# Patient Record
Sex: Female | Born: 1945 | Race: Black or African American | Hispanic: No | Smoking: Former smoker
Health system: Southern US, Community
[De-identification: ages and names within clinical notes are randomized; demographics above are authoritative.]

## PROBLEM LIST (undated history)

## (undated) DIAGNOSIS — I2510 Atherosclerotic heart disease of native coronary artery without angina pectoris: Secondary | ICD-10-CM

## (undated) DIAGNOSIS — Z89512 Acquired absence of left leg below knee: Secondary | ICD-10-CM

## (undated) DIAGNOSIS — I10 Essential (primary) hypertension: Secondary | ICD-10-CM

## (undated) DIAGNOSIS — D649 Anemia, unspecified: Secondary | ICD-10-CM

## (undated) DIAGNOSIS — J449 Chronic obstructive pulmonary disease, unspecified: Secondary | ICD-10-CM

## (undated) DIAGNOSIS — R569 Unspecified convulsions: Secondary | ICD-10-CM

## (undated) DIAGNOSIS — I509 Heart failure, unspecified: Secondary | ICD-10-CM

## (undated) DIAGNOSIS — E785 Hyperlipidemia, unspecified: Secondary | ICD-10-CM

## (undated) DIAGNOSIS — E119 Type 2 diabetes mellitus without complications: Secondary | ICD-10-CM

## (undated) DIAGNOSIS — G4730 Sleep apnea, unspecified: Secondary | ICD-10-CM

## (undated) HISTORY — DX: Acquired absence of left leg below knee: Z89.512

---

## 2003-08-03 ENCOUNTER — Other Ambulatory Visit: Payer: Self-pay

## 2003-12-30 ENCOUNTER — Other Ambulatory Visit: Payer: Self-pay

## 2004-08-12 ENCOUNTER — Ambulatory Visit: Payer: Self-pay | Admitting: Ophthalmology

## 2004-08-22 ENCOUNTER — Ambulatory Visit: Payer: Self-pay | Admitting: Ophthalmology

## 2004-09-15 ENCOUNTER — Emergency Department: Payer: Self-pay | Admitting: Emergency Medicine

## 2006-05-08 HISTORY — PX: BELOW KNEE LEG AMPUTATION: SUR23

## 2006-07-04 ENCOUNTER — Inpatient Hospital Stay: Payer: Self-pay | Admitting: Surgery

## 2006-09-05 ENCOUNTER — Encounter: Payer: Self-pay | Admitting: Family Medicine

## 2006-09-06 ENCOUNTER — Encounter: Payer: Self-pay | Admitting: Family Medicine

## 2006-10-07 ENCOUNTER — Encounter: Payer: Self-pay | Admitting: Family Medicine

## 2006-11-06 ENCOUNTER — Encounter: Payer: Self-pay | Admitting: Family Medicine

## 2006-11-16 ENCOUNTER — Other Ambulatory Visit: Payer: Self-pay

## 2006-11-16 ENCOUNTER — Inpatient Hospital Stay: Payer: Self-pay | Admitting: Vascular Surgery

## 2006-12-07 ENCOUNTER — Encounter: Payer: Self-pay | Admitting: Internal Medicine

## 2006-12-11 ENCOUNTER — Inpatient Hospital Stay: Payer: Self-pay | Admitting: Internal Medicine

## 2006-12-11 ENCOUNTER — Other Ambulatory Visit: Payer: Self-pay

## 2007-02-01 ENCOUNTER — Emergency Department: Payer: Self-pay | Admitting: Unknown Physician Specialty

## 2008-09-30 ENCOUNTER — Ambulatory Visit: Payer: Self-pay | Admitting: Ophthalmology

## 2008-11-25 ENCOUNTER — Ambulatory Visit: Payer: Self-pay | Admitting: Ophthalmology

## 2010-08-30 ENCOUNTER — Ambulatory Visit: Payer: Self-pay | Admitting: Urology

## 2011-09-07 ENCOUNTER — Ambulatory Visit: Payer: Self-pay | Admitting: Family Medicine

## 2012-12-10 ENCOUNTER — Emergency Department: Payer: Self-pay | Admitting: Emergency Medicine

## 2012-12-10 LAB — COMPREHENSIVE METABOLIC PANEL
Alkaline Phosphatase: 121 U/L (ref 50–136)
Anion Gap: 6 — ABNORMAL LOW (ref 7–16)
BUN: 27 mg/dL — ABNORMAL HIGH (ref 7–18)
Bilirubin,Total: 0.4 mg/dL (ref 0.2–1.0)
Chloride: 109 mmol/L — ABNORMAL HIGH (ref 98–107)
Co2: 24 mmol/L (ref 21–32)
Creatinine: 1.53 mg/dL — ABNORMAL HIGH (ref 0.60–1.30)
EGFR (African American): 41 — ABNORMAL LOW
EGFR (Non-African Amer.): 35 — ABNORMAL LOW
Glucose: 195 mg/dL — ABNORMAL HIGH (ref 65–99)
SGPT (ALT): 19 U/L (ref 12–78)

## 2012-12-10 LAB — URINALYSIS, COMPLETE
Glucose,UR: NEGATIVE mg/dL (ref 0–75)
Leukocyte Esterase: NEGATIVE
Protein: 100
WBC UR: 3 /HPF (ref 0–5)

## 2012-12-10 LAB — CBC
MCHC: 32.6 g/dL (ref 32.0–36.0)
MCV: 89 fL (ref 80–100)
Platelet: 199 10*3/uL (ref 150–440)
RDW: 14.2 % (ref 11.5–14.5)
WBC: 9.2 10*3/uL (ref 3.6–11.0)

## 2012-12-10 LAB — DRUG SCREEN, URINE
Barbiturates, Ur Screen: NEGATIVE (ref ?–200)
Cannabinoid 50 Ng, Ur ~~LOC~~: NEGATIVE (ref ?–50)
Cocaine Metabolite,Ur ~~LOC~~: NEGATIVE (ref ?–300)
Phencyclidine (PCP) Ur S: NEGATIVE (ref ?–25)
Tricyclic, Ur Screen: NEGATIVE (ref ?–1000)

## 2013-05-17 ENCOUNTER — Inpatient Hospital Stay: Payer: Self-pay | Admitting: Internal Medicine

## 2013-05-17 LAB — URINALYSIS, COMPLETE
Bilirubin,UR: NEGATIVE
Ketone: NEGATIVE
Nitrite: NEGATIVE
PH: 5 (ref 4.5–8.0)
Protein: NEGATIVE
RBC,UR: 1 /HPF (ref 0–5)
SPECIFIC GRAVITY: 1.01 (ref 1.003–1.030)

## 2013-05-17 LAB — CBC WITH DIFFERENTIAL/PLATELET
BASOS ABS: 0.1 10*3/uL (ref 0.0–0.1)
Basophil %: 0.9 %
Eosinophil #: 0.3 10*3/uL (ref 0.0–0.7)
Eosinophil %: 3 %
HCT: 37.8 % (ref 35.0–47.0)
HGB: 12.4 g/dL (ref 12.0–16.0)
LYMPHS ABS: 2.3 10*3/uL (ref 1.0–3.6)
Lymphocyte %: 23.7 %
MCH: 29 pg (ref 26.0–34.0)
MCHC: 32.7 g/dL (ref 32.0–36.0)
MCV: 89 fL (ref 80–100)
Monocyte #: 0.7 x10 3/mm (ref 0.2–0.9)
Monocyte %: 6.7 %
NEUTROS ABS: 6.5 10*3/uL (ref 1.4–6.5)
Neutrophil %: 65.7 %
Platelet: 192 10*3/uL (ref 150–440)
RBC: 4.27 10*6/uL (ref 3.80–5.20)
RDW: 13.5 % (ref 11.5–14.5)
WBC: 9.9 10*3/uL (ref 3.6–11.0)

## 2013-05-17 LAB — COMPREHENSIVE METABOLIC PANEL
ALBUMIN: 3.4 g/dL (ref 3.4–5.0)
ALK PHOS: 134 U/L — AB
ALT: 22 U/L (ref 12–78)
AST: 14 U/L — AB (ref 15–37)
Anion Gap: 8 (ref 7–16)
BUN: 27 mg/dL — ABNORMAL HIGH (ref 7–18)
Bilirubin,Total: 0.5 mg/dL (ref 0.2–1.0)
CALCIUM: 8.9 mg/dL (ref 8.5–10.1)
CHLORIDE: 101 mmol/L (ref 98–107)
CO2: 25 mmol/L (ref 21–32)
CREATININE: 2.11 mg/dL — AB (ref 0.60–1.30)
EGFR (African American): 28 — ABNORMAL LOW
EGFR (Non-African Amer.): 24 — ABNORMAL LOW
Glucose: 274 mg/dL — ABNORMAL HIGH (ref 65–99)
Osmolality: 283 (ref 275–301)
Potassium: 3.8 mmol/L (ref 3.5–5.1)
SODIUM: 134 mmol/L — AB (ref 136–145)
Total Protein: 7.3 g/dL (ref 6.4–8.2)

## 2013-05-17 LAB — CK TOTAL AND CKMB (NOT AT ARMC)
CK, Total: 143 U/L (ref 21–215)
CK-MB: 1.8 ng/mL (ref 0.5–3.6)

## 2013-05-17 LAB — TROPONIN I

## 2013-05-18 LAB — BASIC METABOLIC PANEL
Anion Gap: 4 — ABNORMAL LOW (ref 7–16)
BUN: 28 mg/dL — AB (ref 7–18)
CALCIUM: 8.9 mg/dL (ref 8.5–10.1)
Chloride: 102 mmol/L (ref 98–107)
Co2: 28 mmol/L (ref 21–32)
Creatinine: 1.84 mg/dL — ABNORMAL HIGH (ref 0.60–1.30)
EGFR (Non-African Amer.): 28 — ABNORMAL LOW
GFR CALC AF AMER: 33 — AB
Glucose: 240 mg/dL — ABNORMAL HIGH (ref 65–99)
OSMOLALITY: 282 (ref 275–301)
Potassium: 4.3 mmol/L (ref 3.5–5.1)
SODIUM: 134 mmol/L — AB (ref 136–145)

## 2013-05-18 LAB — CBC WITH DIFFERENTIAL/PLATELET
Basophil #: 0.3 10*3/uL — ABNORMAL HIGH (ref 0.0–0.1)
Basophil %: 2.4 %
EOS PCT: 2.1 %
Eosinophil #: 0.3 10*3/uL (ref 0.0–0.7)
HCT: 36.3 % (ref 35.0–47.0)
HGB: 11.8 g/dL — AB (ref 12.0–16.0)
Lymphocyte #: 2.3 10*3/uL (ref 1.0–3.6)
Lymphocyte %: 18.7 %
MCH: 28.8 pg (ref 26.0–34.0)
MCHC: 32.5 g/dL (ref 32.0–36.0)
MCV: 89 fL (ref 80–100)
MONO ABS: 0.9 x10 3/mm (ref 0.2–0.9)
Monocyte %: 7.3 %
NEUTROS PCT: 69.5 %
Neutrophil #: 8.5 10*3/uL — ABNORMAL HIGH (ref 1.4–6.5)
PLATELETS: 172 10*3/uL (ref 150–440)
RBC: 4.1 10*6/uL (ref 3.80–5.20)
RDW: 13.6 % (ref 11.5–14.5)
WBC: 12.3 10*3/uL — AB (ref 3.6–11.0)

## 2013-05-19 LAB — BASIC METABOLIC PANEL
Anion Gap: 5 — ABNORMAL LOW (ref 7–16)
BUN: 21 mg/dL — ABNORMAL HIGH (ref 7–18)
CO2: 27 mmol/L (ref 21–32)
CREATININE: 1.41 mg/dL — AB (ref 0.60–1.30)
Calcium, Total: 9.1 mg/dL (ref 8.5–10.1)
Chloride: 104 mmol/L (ref 98–107)
EGFR (Non-African Amer.): 39 — ABNORMAL LOW
GFR CALC AF AMER: 45 — AB
Glucose: 282 mg/dL — ABNORMAL HIGH (ref 65–99)
Osmolality: 285 (ref 275–301)
Potassium: 4.3 mmol/L (ref 3.5–5.1)
Sodium: 136 mmol/L (ref 136–145)

## 2013-05-19 LAB — CBC WITH DIFFERENTIAL/PLATELET
Basophil #: 0.1 10*3/uL (ref 0.0–0.1)
Basophil %: 0.9 %
EOS ABS: 0.3 10*3/uL (ref 0.0–0.7)
Eosinophil %: 2.5 %
HCT: 36.1 % (ref 35.0–47.0)
HGB: 11.8 g/dL — AB (ref 12.0–16.0)
Lymphocyte #: 2.1 10*3/uL (ref 1.0–3.6)
Lymphocyte %: 19.7 %
MCH: 29.1 pg (ref 26.0–34.0)
MCHC: 32.9 g/dL (ref 32.0–36.0)
MCV: 89 fL (ref 80–100)
Monocyte #: 0.7 x10 3/mm (ref 0.2–0.9)
Monocyte %: 6.3 %
NEUTROS ABS: 7.6 10*3/uL — AB (ref 1.4–6.5)
NEUTROS PCT: 70.6 %
Platelet: 182 10*3/uL (ref 150–440)
RBC: 4.07 10*6/uL (ref 3.80–5.20)
RDW: 13.7 % (ref 11.5–14.5)
WBC: 10.8 10*3/uL (ref 3.6–11.0)

## 2013-05-20 LAB — URINE CULTURE

## 2013-07-16 ENCOUNTER — Ambulatory Visit: Payer: Self-pay | Admitting: Family Medicine

## 2013-08-28 ENCOUNTER — Ambulatory Visit: Payer: Self-pay | Admitting: Family Medicine

## 2014-08-29 NOTE — Discharge Summary (Signed)
PATIENT NAME:  Barbara Farley, Barbara Farley MR#:  [id number] DATE OF BIRTH:  April 09, 1946  DATE OF ADMISSION:  05/17/2013 DATE OF DISCHARGE:  05/19/2013  DISCHARGE DIAGNOSES: 1.  Acute encephalopathy, likely due to urinary tract infection, now back to baseline.  2.  Chronic kidney disease at baseline.   SECONDARY DIAGNOSES: 1.  Hypertension.  2.  Hyperlipidemia.  3.  Diabetes mellitus.  4.  Coronary artery disease.  5.  Anemia of chronic disease.  6.  Morbid obesity.  7.  Sleep apnea.  8.  Left below-knee amputation.   CONSULTATION: Physical therapy.   PROCEDURES AND RADIOLOGY: CT scan of the head without contrast on January 10,  showed chronic ischemic white matter disease. No acute intracranial abnormality.   Chest x-ray on January 10,  showed cardiomegaly without any acute cardiopulmonary disease.   MAJOR LABORATORY PANEL: Urinalysis on admission showed 3+ bacteria, 42 WBCs, 1+ leukocyte esterase, and WBC in clumps.   The urine culture grew more than 100,000 colonies of strep viridans.    HISTORY AND SHORT HOSPITAL COURSE: The patient is a 69 year old female with the above-mentioned medical problems who was admitted for acute encephalopathy secondary to urinary tract infection. Please see Dr. Ward Givens  dictated history and physical for further details. The patient was treated with IV antibiotics was feeling close to her baseline, and was discharged back to her place on January 12, in stable condition, as she was close to baseline on the date of discharge. Her vital signs are as follows: Temperature 98.6, heart rate 100 per minute, respirations 18 per minute, blood pressure 154/78 mmHg, she was saturating 94% on room air.   PERTINENT PHYSICAL EXAMINATION ON THE DATE OF DISCHARGE:   CARDIOVASCULAR: S1, S2 normal. No murmurs, rubs, or gallop.  LUNGS: Clear to auscultation bilaterally. No wheezing, rales, rhonchi or crepitation.  ABDOMEN: Soft, benign.  NEUROLOGIC: Nonfocal examination.  All  other physical examination remained at baseline.   DISCHARGE MEDICATIONS: 1.  Aspirin 81 mg p.o. daily.  2.  Isosorbide mononitrate 30 mg p.o. daily.  3.  Lisinopril 40 mg p.o. daily.  4.  Paroxetine 20 mg p.o. daily.  5.  Omeprazole 20 mg p.o. daily.  6.  Oxybutynin 10 mg p.o. daily.  7.  Novolin 70/30, 75 units subcutaneous every morning and 25 units every evening.  8.  Lasix 40 mg p.o. daily.  9.  Ergocalciferol 50,000 international units once every month on the first Monday of every month after eight weeks.  10. Amlodipine/atorvastatin 5/40 mg 1 tablet p.o. daily.  11.  Colace 100 mg p.o. b.i.d. as needed.  12.  Levaquin 250 mg p.o. daily for the rest of the course as prescribed.   DISCHARGE DIET: Regular.   DISCHARGE ACTIVITY: As tolerated.   DISCHARGE INSTRUCTIONS AND FOLLOW-UP: The patient was instructed to follow up with her primary care physician with [facility] Dr. Dorthea Cove in 1 to 2 weeks.   TOTAL TIME DISCHARGING THIS PATIENT: 35 minutes.   ____________________________ Lucina Mellow. Manuella Ghazi, MD vss:cc D: 05/21/2013 23:08:10 ET T: 05/21/2013 23:51:31 ET JOB#: [id number]  cc: Ephram Kornegay S. Manuella Ghazi, MD, <Dictator> Danelle Berry. Derrek Monaco, [place] MD ELECTRONICALLY SIGNED 05/28/2013 14:25

## 2014-08-29 NOTE — H&P (Signed)
Barbara Farley NAME:  Barbara Farley, Barbara Farley MR#:  [id number] DATE OF BIRTH:  1945/10/04  DATE OF ADMISSION:  05/18/2013  PRIMARY CARE PHYSICIAN: Dorthea Cove, M.D.   REFERRING PHYSICIAN:  Dr. Kerman Passey.   CHIEF COMPLAINT: Altered mental status.   HISTORY OF PRESENT ILLNESS: Barbara Farley is a 69 year old African-American female with past medical history of hypertension, diabetes mellitus, coronary artery disease, chronic kidney disease, left BKA, is brought to the Emergency Department by EMS for altered mental status. The Barbara Farley has home health, came to visit the Barbara Farley and was found to be somewhat confused.  Concerning this, the Barbara Farley is brought to the Emergency Department. Workup in the Emergency Department with CT head without contrast: No acute intracranial abnormality. The Barbara Farley is found to have found to be found to have a urinary tract infection. Per Barbara Farley's sister, the Barbara Farley had some baseline slurred speech; however, this significantly worse from the baseline. The Barbara Farley received one dose of Rocephin in the Emergency Department.  Urine cultures have been obtained. The Barbara Farley is able to provide most of the history. The Barbara Farley states that started to experiences severe generalized body aches, started around 7:00 p.m. Denies having any headache, nausea, vomiting, abdominal pain, denies having any cough.   PAST MEDICAL HISTORY: 1.  Hypertension.  2.  Hyperlipidemia.  3.  Diabetes mellitus, insulin-dependent.  4.  Coronary artery disease.  5.  Anemia of chronic disease.  6.  Morbid obesity. 7.  Sleep apnea, on CPAP.  8.  History of tobacco use.  9.  History of gluteal abscess, status post I and D.  10.  Left BKA.    ALLERGIES: No known drug allergies.   HOME MEDICATIONS: 1.  Paroxetine 20 mg once a day.  2.  Oxybutynin 10 mg once a day.  3.  Omeprazole 20 mg daily. 4.   Novolin 70/30, 75 units daily.  5.  Lisinopril 40 mg once a day.  6.  Imdur 1 tablet once a day.  7.  Lasix  40 mg once a day.  8.  Vitamin D 50,000 units weekly.  9.  Aspirin 81 mg once a day.  10.  Amlodipine/atorvastatin 1 tablet once a day.   SOCIAL HISTORY: Continues to smoke, denies drinking alcohol.  Lives by herself. The Barbara Farley's sister helps her with some of the activities.   FAMILY HISTORY: Positive for diabetes mellitus.   REVIEW OF SYSTEMS: CONSTITUTIONAL: Generalized weakness.  EYES: Has left eye blindness.  ENT: No sore throat, cough, shortness of breath.  RESPIRATORY: No cough, shortness of breath.  CARDIOVASCULAR: No chest pain, palpitations.  GASTROINTESTINAL: No nausea, vomiting, abdominal pain.  GENITOURINARY: No dysuria or hematuria.  ENDOCRINE: The Barbara Farley has diagnosis of diabetes mellitus. HEMATOLOGIC: No easy bruising or bleeding.  SKIN: No rash or lesions.  MUSCULOSKELETAL: The Barbara Farley states has generalized body aches.  NEUROLOGICAL: No weakness or numbness in any part of the body.   PHYSICAL EXAMINATION: GENERAL: This is a well-developed, well-nourished, age-appropriate female lying down in the bed, not in distress.  VITAL SIGNS: Temperature 98.4, pulse 91, blood pressure 139/67, respiratory rate of 22, oxygen saturations 96% on room air.  HEENT: Head normocephalic, atraumatic. There is no sclerae icterus. Conjunctivae normal. Mucous membranes are dry. Could not examine the oropharynx.   NECK: Supple. No lymphadenopathy. No JVD. No carotid bruit. No thyromegaly.  CHEST: Has no focal tenderness.  LUNGS: Bilaterally clear to auscultation.  HEART: S1, S2 regular. No murmurs are heard.  ABDOMEN: Bowel sounds present. Soft, nontender,  nondistended. Could not appreciate any hepatosplenomegaly.  EXTREMITIES: Left below knee amputation. Right pulses palpable. Good range of motion in all the extremities.  NEUROLOGIC: The Barbara Farley is somewhat somnolent. Oriented to place, person and time. No pronator drift. No meningeal signs. Moving all four extremities.   LABS: CMP,  BMP;  BUN 37, creatinine of 1.84, basic metabolic profile within normal limits.   CK 143, CK-MB 1.8. Troponin less than 0.02.   CBC is completely within normal limits.   ASSESSMENT AND PLAN: Barbara Farley is a 69 year old female who comes to the Emergency Department with altered mental status.  1.  Altered mental status. This could be from the urinary tract infection, however cannot also exclude stroke. Will treat the underlying infection and follow up. If  Barbara Farley continues to have the confusion by tomorrow morning, we will consider getting an MRI.  2.  Urinary tract infection. Obtain urine cultures and keep the Barbara Farley on Rocephin.  3.  Diabetes mellitus. Continue the home dose of the 70/30 insulin.  4.  Hypertension, currently well controlled. Continue with home medications.  5.  Keep the Barbara Farley on deep vein thrombosis prophylaxis with Lovenox.   TIME SPENT: 50 minutes.     ____________________________ Monica Becton, MD pv:NTS D: 05/18/2013 01:05:09 ET T: 05/18/2013 01:20:07 ET JOB#: [id number]  cc: Monica Becton, MD, <Dictator> Danelle Berry. Derrek Monaco, MD Grier Mitts Mikal Wisman MD ELECTRONICALLY SIGNED 06/01/2013 21:13

## 2015-05-29 ENCOUNTER — Emergency Department
Admission: EM | Admit: 2015-05-29 | Discharge: 2015-05-29 | Disposition: A | Discharge status: Home / Self Care | Payer: Medicare (Managed Care) | Attending: Emergency Medicine | Admitting: Emergency Medicine

## 2015-05-29 ENCOUNTER — Encounter: Payer: Self-pay | Admitting: Emergency Medicine

## 2015-05-29 ENCOUNTER — Emergency Department: Payer: Medicare (Managed Care)

## 2015-05-29 DIAGNOSIS — E119 Type 2 diabetes mellitus without complications: Secondary | ICD-10-CM | POA: Insufficient documentation

## 2015-05-29 DIAGNOSIS — F17200 Nicotine dependence, unspecified, uncomplicated: Secondary | ICD-10-CM | POA: Diagnosis not present

## 2015-05-29 DIAGNOSIS — Z89512 Acquired absence of left leg below knee: Secondary | ICD-10-CM | POA: Diagnosis not present

## 2015-05-29 DIAGNOSIS — N12 Tubulo-interstitial nephritis, not specified as acute or chronic: Secondary | ICD-10-CM | POA: Diagnosis not present

## 2015-05-29 DIAGNOSIS — R109 Unspecified abdominal pain: Secondary | ICD-10-CM

## 2015-05-29 DIAGNOSIS — I10 Essential (primary) hypertension: Secondary | ICD-10-CM | POA: Insufficient documentation

## 2015-05-29 HISTORY — DX: Essential (primary) hypertension: I10

## 2015-05-29 HISTORY — DX: Unspecified convulsions: R56.9

## 2015-05-29 HISTORY — DX: Type 2 diabetes mellitus without complications: E11.9

## 2015-05-29 LAB — COMPREHENSIVE METABOLIC PANEL
ALT: 21 U/L (ref 14–54)
ANION GAP: 8 (ref 5–15)
AST: 18 U/L (ref 15–41)
Albumin: 3.8 g/dL (ref 3.5–5.0)
Alkaline Phosphatase: 110 U/L (ref 38–126)
BILIRUBIN TOTAL: 0.8 mg/dL (ref 0.3–1.2)
BUN: 35 mg/dL — ABNORMAL HIGH (ref 6–20)
CHLORIDE: 103 mmol/L (ref 101–111)
CO2: 27 mmol/L (ref 22–32)
Calcium: 9.1 mg/dL (ref 8.9–10.3)
Creatinine, Ser: 2.05 mg/dL — ABNORMAL HIGH (ref 0.44–1.00)
GFR, EST AFRICAN AMERICAN: 28 mL/min — AB (ref >60)
GFR, EST NON AFRICAN AMERICAN: 24 mL/min — AB (ref >60)
Glucose, Bld: 214 mg/dL — ABNORMAL HIGH (ref 65–99)
POTASSIUM: 4.1 mmol/L (ref 3.5–5.1)
Sodium: 138 mmol/L (ref 135–145)
TOTAL PROTEIN: 7.4 g/dL (ref 6.5–8.1)

## 2015-05-29 LAB — CBC WITH DIFFERENTIAL/PLATELET
BASOS ABS: 0.1 10*3/uL (ref 0–0.1)
Basophils Relative: 1 %
EOS PCT: 1 %
Eosinophils Absolute: 0.1 10*3/uL (ref 0–0.7)
HEMATOCRIT: 38.1 % (ref 35.0–47.0)
Hemoglobin: 12.2 g/dL (ref 12.0–16.0)
LYMPHS PCT: 19 %
Lymphs Abs: 1.5 10*3/uL (ref 1.0–3.6)
MCH: 27.7 pg (ref 26.0–34.0)
MCHC: 31.9 g/dL — AB (ref 32.0–36.0)
MCV: 86.9 fL (ref 80.0–100.0)
MONO ABS: 0.6 10*3/uL (ref 0.2–0.9)
MONOS PCT: 8 %
Neutro Abs: 5.6 10*3/uL (ref 1.4–6.5)
Neutrophils Relative %: 71 %
PLATELETS: 197 10*3/uL (ref 150–440)
RBC: 4.39 MIL/uL (ref 3.80–5.20)
RDW: 14 % (ref 11.5–14.5)
WBC: 7.9 10*3/uL (ref 3.6–11.0)

## 2015-05-29 LAB — URINALYSIS COMPLETE WITH MICROSCOPIC (ARMC ONLY)
Bilirubin Urine: NEGATIVE
Ketones, ur: NEGATIVE mg/dL
Nitrite: NEGATIVE
PH: 5 (ref 5.0–8.0)
Protein, ur: 30 mg/dL — AB
Specific Gravity, Urine: 1.008 (ref 1.005–1.030)

## 2015-05-29 LAB — TROPONIN I: Troponin I: 0.03 ng/mL (ref <0.031)

## 2015-05-29 LAB — LIPASE, BLOOD: LIPASE: 19 U/L (ref 11–51)

## 2015-05-29 MED ORDER — ONDANSETRON HCL 4 MG PO TABS: 4.0000 mg | ORAL_TABLET | Freq: Every day | ORAL | Status: DC | PRN

## 2015-05-29 MED ORDER — ONDANSETRON HCL 4 MG/2ML IJ SOLN
4.0000 mg | Freq: Once | INTRAMUSCULAR | Status: AC
  Administered 2015-05-29: 4 mg via INTRAVENOUS
  Filled 2015-05-29: qty 2

## 2015-05-29 MED ORDER — SODIUM CHLORIDE 0.9 % IV BOLUS (SEPSIS)
500.0000 mL | Freq: Once | INTRAVENOUS | Status: AC
  Administered 2015-05-29: 500 mL via INTRAVENOUS

## 2015-05-29 MED ORDER — CEPHALEXIN 500 MG PO CAPS: 500.0000 mg | ORAL_CAPSULE | Freq: Three times a day (TID) | ORAL | Status: AC

## 2015-05-29 MED ORDER — DEXTROSE 5 % IV SOLN
2.0000 g | Freq: Once | INTRAVENOUS | Status: AC
  Administered 2015-05-29: 2 g via INTRAVENOUS
  Filled 2015-05-29: qty 2

## 2015-05-29 MED ORDER — MORPHINE SULFATE (PF) 4 MG/ML IV SOLN
4.0000 mg | Freq: Once | INTRAVENOUS | Status: AC
  Administered 2015-05-29: 4 mg via INTRAVENOUS
  Filled 2015-05-29: qty 1

## 2015-05-29 NOTE — ED Notes (Signed)
Pt states needs transport home - offered to call her a cab, but she states doesn't have the money. Advised ems could be called but not covered by insurance since not medically necessary. Pt asked that I call the ems, not a cab or family. (states no family/ friends to help her)

## 2015-05-29 NOTE — ED Notes (Addendum)
Patient O2 dropping to 83% while she's sleeping. RN and MD notified. Patient placed on 2L nasal cannula per MD order. O2  currently at 90%

## 2015-05-29 NOTE — ED Provider Notes (Signed)
[facility] Emergency Department Provider Note  ____________________________________________  Time seen: Seen upon arrival to the emergency department  I have reviewed the triage vital signs and the nursing notes.   HISTORY  Chief Complaint Flank Pain    HPI Barbara Farley is a 70 y.o. female history of diabetes and hypertension who is presenting today with bilateral flank and abdominal pain. She says that the pain is worse on the left and is sharp and intermittent. She says that the pain as a 5 out of 10 right now. It is associated with nausea and vomiting. She says that the pain is radiating from both flanks up under her breasts bilaterally into her abdomen. She says that she is a history of kidney infections. She has been urinating more frequently over the past month but is not having any burning. She says that she has had kidney infections in the past without any burning with urination. She said that her sugar was over 600 this morning and she took insulin and for EMS was in the 340s.   Past Medical History  Diagnosis Date  . Diabetes mellitus without complication ([place])   . Hypertension   . Seizures ([place])     There are no active problems to display for this patient.   Past Surgical History  Procedure Laterality Date  . Below knee leg amputation      No current outpatient prescriptions on file.  Allergies Review of patient's allergies indicates no known allergies.  History reviewed. No pertinent family history.  Social History Social History  Substance Use Topics  . Smoking status: Current Some Day Smoker  . Smokeless tobacco: None  . Alcohol Use: No    Review of Systems Constitutional: No fever/chills Eyes: No visual changes. ENT: No sore throat. Cardiovascular: Denies chest pain. Respiratory: Denies shortness of breath. Gastrointestinal: No diarrhea.  No constipation. Genitourinary: Negative for dysuria. Musculoskeletal:  Bilateral flank pain Skin: Negative for rash. Neurological: Negative for headaches, focal weakness or numbness.  10-point ROS otherwise negative.  ____________________________________________   PHYSICAL EXAM:  VITAL SIGNS: ED Triage Vitals  Enc Vitals Group     BP --      Pulse --      Resp --      Temp --      Temp src --      SpO2 --      Weight 05/29/15 1310 300 lb (136.079 kg)     Height 05/29/15 1310 5\' 6"  (1.676 m)     Head Cir --      Peak Flow --      Pain Score 05/29/15 1311 5     Pain Loc --      Pain Edu? --      Excl. in [place]? --     Constitutional: Alert and oriented. Well appearing and in no acute distress. Eyes: Conjunctivae are normal. PERRL. EOMI. Head: Atraumatic. Nose: No congestion/rhinnorhea. Mouth/Throat: Mucous membranes are moist.  Oropharynx non-erythematous. Neck: No stridor.   Cardiovascular: Normal rate, regular rhythm. Grossly normal heart sounds.  Good peripheral circulation. Respiratory: Normal respiratory effort.  No retractions. Lungs CTAB. Gastrointestinal: Soft with mild diffuse tenderness. No distention. No abdominal bruits. Bilateral CVA tenderness to palpation. Musculoskeletal: No lower extremity tenderness nor edema.  No joint effusions. Left lower extremity BKA. Neurologic:  Normal speech and language. No gross focal neurologic deficits are appreciated. No gait instability. Skin:  Skin is warm, dry and intact. No rash noted. Psychiatric: Mood  and affect are normal. Speech and behavior are normal.  ____________________________________________   LABS (all labs ordered are listed, but only abnormal results are displayed)  Labs Reviewed  CBC WITH DIFFERENTIAL/PLATELET - Abnormal; Notable for the following:    MCHC 31.9 (*)    All other components within normal limits  COMPREHENSIVE METABOLIC PANEL - Abnormal; Notable for the following:    Glucose, Bld 214 (*)    BUN 35 (*)    Creatinine, Ser 2.05 (*)    GFR calc non Af Amer 24  (*)    GFR calc Af Amer 28 (*)    All other components within normal limits  URINALYSIS COMPLETEWITH MICROSCOPIC ([facility] ONLY) - Abnormal; Notable for the following:    Color, Urine YELLOW (*)    APPearance CLOUDY (*)    Glucose, UA >500 (*)    Hgb urine dipstick 1+ (*)    Protein, ur 30 (*)    Leukocytes, UA 3+ (*)    Bacteria, UA MANY (*)    Squamous Epithelial / LPF 0-5 (*)    All other components within normal limits  URINE CULTURE  TROPONIN I  LIPASE, BLOOD   ____________________________________________  EKG  ED ECG REPORT I, Doran Stabler, the attending physician, personally viewed and interpreted this ECG.   Date: 05/29/2015  EKG Time: 1321  Rate: 85  Rhythm: normal sinus rhythm  Axis: Borderline left axis deviation.  Intervals:none  ST&T Change: T-wave inversion in aVL. Borderline T-wave inversion in V2. No elevation in V3. Likely read by EKG machine because of static in baseline.  ____________________________________________  RADIOLOGY  CAT scan of the abdomen and pelvis with cholelithiasis without clubbing factors. No acute abnormalities noted. ____________________________________________   PROCEDURES  ____________________________________________   INITIAL IMPRESSION / ASSESSMENT AND PLAN / ED COURSE  Pertinent labs & imaging results that were available during my care of the patient were reviewed by me and considered in my medical decision making (see chart for details).  Patient's pain is controlled at this time. Now tolerating by mouth solids and fluids. Likely diagnosis is pyelonephritis. No kidney stones found on CAT scan and patient has urinary tract infection findings on her urinalysis. We'll give her dose of ceftriaxone in the emergency department. We'll discharge her home with Keflex and Zofran. Patient says the plan is one to comply. Patient with reassuring EKG. Slight acute renal failure likely from dehydration. Patient did have several  episodes of vomiting prior to arrival. Please from mild dehydration. Given 1 L of fluids in the emergency department. She knows to continue plenty of fluids at home. ____________________________________________   FINAL CLINICAL IMPRESSION(S) / ED DIAGNOSES  Final diagnoses:  Bilateral flank pain   pyelonephritis.    Orbie Pyo, MD 05/29/15 1640

## 2015-05-29 NOTE — ED Notes (Signed)
bs 349 - took her insulin at 9am. Flank pain today with frequent urination

## 2015-05-29 NOTE — Discharge Instructions (Signed)

## 2015-05-31 LAB — URINE CULTURE: Culture: >=100000

## 2015-06-18 ENCOUNTER — Emergency Department: Payer: Medicare (Managed Care)

## 2015-06-18 ENCOUNTER — Inpatient Hospital Stay
Admission: EM | Admit: 2015-06-18 | Discharge: 2015-06-19 | DRG: 292 | Disposition: A | Discharge status: Home / Self Care | Payer: Medicare (Managed Care) | Attending: Internal Medicine | Admitting: Internal Medicine

## 2015-06-18 ENCOUNTER — Encounter: Payer: Self-pay | Admitting: Emergency Medicine

## 2015-06-18 DIAGNOSIS — Z79899 Other long term (current) drug therapy: Secondary | ICD-10-CM | POA: Diagnosis not present

## 2015-06-18 DIAGNOSIS — E1122 Type 2 diabetes mellitus with diabetic chronic kidney disease: Secondary | ICD-10-CM | POA: Diagnosis present

## 2015-06-18 DIAGNOSIS — R079 Chest pain, unspecified: Secondary | ICD-10-CM | POA: Diagnosis not present

## 2015-06-18 DIAGNOSIS — I509 Heart failure, unspecified: Secondary | ICD-10-CM | POA: Diagnosis present

## 2015-06-18 DIAGNOSIS — Z6841 Body Mass Index (BMI) 40.0 and over, adult: Secondary | ICD-10-CM | POA: Diagnosis not present

## 2015-06-18 DIAGNOSIS — D649 Anemia, unspecified: Secondary | ICD-10-CM | POA: Diagnosis present

## 2015-06-18 DIAGNOSIS — N179 Acute kidney failure, unspecified: Secondary | ICD-10-CM | POA: Diagnosis present

## 2015-06-18 DIAGNOSIS — J449 Chronic obstructive pulmonary disease, unspecified: Secondary | ICD-10-CM | POA: Diagnosis present

## 2015-06-18 DIAGNOSIS — E1165 Type 2 diabetes mellitus with hyperglycemia: Secondary | ICD-10-CM | POA: Diagnosis present

## 2015-06-18 DIAGNOSIS — N183 Chronic kidney disease, stage 3 (moderate): Secondary | ICD-10-CM | POA: Diagnosis present

## 2015-06-18 DIAGNOSIS — Z89512 Acquired absence of left leg below knee: Secondary | ICD-10-CM

## 2015-06-18 DIAGNOSIS — I130 Hypertensive heart and chronic kidney disease with heart failure and stage 1 through stage 4 chronic kidney disease, or unspecified chronic kidney disease: Secondary | ICD-10-CM | POA: Diagnosis not present

## 2015-06-18 DIAGNOSIS — F17210 Nicotine dependence, cigarettes, uncomplicated: Secondary | ICD-10-CM | POA: Diagnosis present

## 2015-06-18 DIAGNOSIS — E785 Hyperlipidemia, unspecified: Secondary | ICD-10-CM | POA: Diagnosis present

## 2015-06-18 DIAGNOSIS — I2510 Atherosclerotic heart disease of native coronary artery without angina pectoris: Secondary | ICD-10-CM | POA: Diagnosis present

## 2015-06-18 DIAGNOSIS — Z9119 Patient's noncompliance with other medical treatment and regimen: Secondary | ICD-10-CM

## 2015-06-18 DIAGNOSIS — Z7982 Long term (current) use of aspirin: Secondary | ICD-10-CM

## 2015-06-18 DIAGNOSIS — R569 Unspecified convulsions: Secondary | ICD-10-CM | POA: Diagnosis present

## 2015-06-18 DIAGNOSIS — I248 Other forms of acute ischemic heart disease: Secondary | ICD-10-CM | POA: Diagnosis present

## 2015-06-18 DIAGNOSIS — G4730 Sleep apnea, unspecified: Secondary | ICD-10-CM | POA: Diagnosis present

## 2015-06-18 HISTORY — DX: Anemia, unspecified: D64.9

## 2015-06-18 HISTORY — DX: Sleep apnea, unspecified: G47.30

## 2015-06-18 HISTORY — DX: Heart failure, unspecified: I50.9

## 2015-06-18 HISTORY — DX: Atherosclerotic heart disease of native coronary artery without angina pectoris: I25.10

## 2015-06-18 HISTORY — DX: Morbid (severe) obesity due to excess calories: E66.01

## 2015-06-18 HISTORY — DX: Chronic obstructive pulmonary disease, unspecified: J44.9

## 2015-06-18 HISTORY — DX: Hyperlipidemia, unspecified: E78.5

## 2015-06-18 LAB — BASIC METABOLIC PANEL
ANION GAP: 9 (ref 5–15)
BUN: 30 mg/dL — ABNORMAL HIGH (ref 6–20)
CALCIUM: 9.3 mg/dL (ref 8.9–10.3)
CHLORIDE: 105 mmol/L (ref 101–111)
CO2: 24 mmol/L (ref 22–32)
CREATININE: 1.96 mg/dL — AB (ref 0.44–1.00)
GFR calc non Af Amer: 25 mL/min — ABNORMAL LOW (ref >60)
GFR, EST AFRICAN AMERICAN: 29 mL/min — AB (ref >60)
Glucose, Bld: 136 mg/dL — ABNORMAL HIGH (ref 65–99)
Potassium: 5.1 mmol/L (ref 3.5–5.1)
SODIUM: 138 mmol/L (ref 135–145)

## 2015-06-18 LAB — GLUCOSE, CAPILLARY
GLUCOSE-CAPILLARY: 244 mg/dL — AB (ref 65–99)
GLUCOSE-CAPILLARY: 347 mg/dL — AB (ref 65–99)

## 2015-06-18 LAB — CBC
HCT: 40.6 % (ref 35.0–47.0)
HEMOGLOBIN: 13 g/dL (ref 12.0–16.0)
MCH: 28.3 pg (ref 26.0–34.0)
MCHC: 32.1 g/dL (ref 32.0–36.0)
MCV: 88.1 fL (ref 80.0–100.0)
Platelets: 176 10*3/uL (ref 150–440)
RBC: 4.6 MIL/uL (ref 3.80–5.20)
RDW: 13.7 % (ref 11.5–14.5)
WBC: 9.3 10*3/uL (ref 3.6–11.0)

## 2015-06-18 LAB — TROPONIN I: Troponin I: 0.04 ng/mL — ABNORMAL HIGH (ref <0.031)

## 2015-06-18 LAB — TSH: TSH: 0.908 u[IU]/mL (ref 0.350–4.500)

## 2015-06-18 LAB — BRAIN NATRIURETIC PEPTIDE: B NATRIURETIC PEPTIDE 5: 33 pg/mL (ref 0.0–100.0)

## 2015-06-18 MED ORDER — FAMOTIDINE 20 MG PO TABS
20.0000 mg | ORAL_TABLET | Freq: Two times a day (BID) | ORAL | Status: DC
  Administered 2015-06-18 – 2015-06-19 (×2): 20 mg via ORAL
  Filled 2015-06-18 (×2): qty 1

## 2015-06-18 MED ORDER — ENOXAPARIN SODIUM 30 MG/0.3ML ~~LOC~~ SOLN
30.0000 mg | SUBCUTANEOUS | Status: DC
  Administered 2015-06-18: 30 mg via SUBCUTANEOUS
  Filled 2015-06-18: qty 0.3

## 2015-06-18 MED ORDER — DOCUSATE SODIUM 100 MG PO CAPS
100.0000 mg | ORAL_CAPSULE | Freq: Every day | ORAL | Status: DC
  Administered 2015-06-18 – 2015-06-19 (×2): 100 mg via ORAL
  Filled 2015-06-18 (×2): qty 1

## 2015-06-18 MED ORDER — ISOSORBIDE MONONITRATE ER 30 MG PO TB24
30.0000 mg | ORAL_TABLET | Freq: Every day | ORAL | Status: DC
  Administered 2015-06-18: 30 mg via ORAL
  Filled 2015-06-18 (×2): qty 1

## 2015-06-18 MED ORDER — ONDANSETRON HCL 4 MG PO TABS: 4.0000 mg | ORAL_TABLET | Freq: Four times a day (QID) | ORAL | Status: DC | PRN

## 2015-06-18 MED ORDER — SODIUM CHLORIDE 0.9% FLUSH: 3.0000 mL | INTRAVENOUS | Status: DC | PRN

## 2015-06-18 MED ORDER — ACETAMINOPHEN 325 MG PO TABS: 650.0000 mg | ORAL_TABLET | Freq: Four times a day (QID) | ORAL | Status: DC | PRN

## 2015-06-18 MED ORDER — SENNA 8.6 MG PO TABS
2.0000 | ORAL_TABLET | Freq: Every day | ORAL | Status: DC
  Administered 2015-06-18: 17.2 mg via ORAL
  Filled 2015-06-18: qty 2

## 2015-06-18 MED ORDER — INSULIN ASPART 100 UNIT/ML ~~LOC~~ SOLN: 0.0000 [IU] | Freq: Three times a day (TID) | SUBCUTANEOUS | Status: DC

## 2015-06-18 MED ORDER — FUROSEMIDE 10 MG/ML IJ SOLN
20.0000 mg | Freq: Once | INTRAMUSCULAR | Status: AC
  Administered 2015-06-18: 20 mg via INTRAVENOUS
  Filled 2015-06-18: qty 4

## 2015-06-18 MED ORDER — SODIUM CHLORIDE 0.9 % IV SOLN: 250.0000 mL | INTRAVENOUS | Status: DC | PRN

## 2015-06-18 MED ORDER — ATORVASTATIN CALCIUM 20 MG PO TABS
40.0000 mg | ORAL_TABLET | Freq: Every day | ORAL | Status: DC
  Administered 2015-06-18: 40 mg via ORAL
  Filled 2015-06-18: qty 2

## 2015-06-18 MED ORDER — PAROXETINE HCL 20 MG PO TABS
20.0000 mg | ORAL_TABLET | Freq: Every day | ORAL | Status: DC
  Administered 2015-06-19: 20 mg via ORAL
  Filled 2015-06-18: qty 1

## 2015-06-18 MED ORDER — INSULIN ASPART 100 UNIT/ML ~~LOC~~ SOLN
0.0000 [IU] | Freq: Every day | SUBCUTANEOUS | Status: DC
  Administered 2015-06-18: 4 [IU] via SUBCUTANEOUS
  Filled 2015-06-18: qty 4

## 2015-06-18 MED ORDER — ASPIRIN EC 325 MG PO TBEC: 325.0000 mg | DELAYED_RELEASE_TABLET | Freq: Every day | ORAL | Status: DC

## 2015-06-18 MED ORDER — AMLODIPINE BESYLATE 5 MG PO TABS
5.0000 mg | ORAL_TABLET | Freq: Every day | ORAL | Status: DC
  Filled 2015-06-18: qty 1

## 2015-06-18 MED ORDER — ONDANSETRON HCL 4 MG/2ML IJ SOLN: 4.0000 mg | Freq: Four times a day (QID) | INTRAMUSCULAR | Status: DC | PRN

## 2015-06-18 MED ORDER — ASPIRIN EC 81 MG PO TBEC
81.0000 mg | DELAYED_RELEASE_TABLET | Freq: Every day | ORAL | Status: DC
  Administered 2015-06-19: 81 mg via ORAL
  Filled 2015-06-18: qty 1

## 2015-06-18 MED ORDER — INSULIN ASPART 100 UNIT/ML ~~LOC~~ SOLN
0.0000 [IU] | Freq: Three times a day (TID) | SUBCUTANEOUS | Status: DC
  Administered 2015-06-18: 3 [IU] via SUBCUTANEOUS
  Administered 2015-06-19 (×2): 7 [IU] via SUBCUTANEOUS
  Filled 2015-06-18 (×2): qty 7
  Filled 2015-06-18: qty 3

## 2015-06-18 MED ORDER — FUROSEMIDE 10 MG/ML IJ SOLN
40.0000 mg | Freq: Two times a day (BID) | INTRAMUSCULAR | Status: DC
  Administered 2015-06-19: 40 mg via INTRAVENOUS
  Filled 2015-06-18: qty 4

## 2015-06-18 MED ORDER — OXYBUTYNIN CHLORIDE ER 10 MG PO TB24
10.0000 mg | ORAL_TABLET | Freq: Every day | ORAL | Status: DC
Start: 2015-06-18 — End: 2015-06-19
  Administered 2015-06-19: 10 mg via ORAL
  Filled 2015-06-18: qty 1

## 2015-06-18 MED ORDER — SODIUM CHLORIDE 0.9% FLUSH
3.0000 mL | Freq: Two times a day (BID) | INTRAVENOUS | Status: DC
  Administered 2015-06-18 – 2015-06-19 (×2): 3 mL via INTRAVENOUS

## 2015-06-18 MED ORDER — ASPIRIN 81 MG PO CHEW
324.0000 mg | CHEWABLE_TABLET | Freq: Once | ORAL | Status: AC
  Administered 2015-06-18: 324 mg via ORAL
  Filled 2015-06-18: qty 4

## 2015-06-18 MED ORDER — ACETAMINOPHEN 650 MG RE SUPP: 650.0000 mg | Freq: Four times a day (QID) | RECTAL | Status: DC | PRN

## 2015-06-18 MED ORDER — SODIUM CHLORIDE 0.9% FLUSH: 3.0000 mL | Freq: Two times a day (BID) | INTRAVENOUS | Status: DC

## 2015-06-18 NOTE — Progress Notes (Signed)
Patient alert and oriented x4. Oriented to room, unit, and call bell. Admission completed. No complaints at this time. Will cont to assess. Skin assessment verified by Donella Stade, RN. Telemetry box verified by Gari Crown, NT. Wilnette Kales

## 2015-06-18 NOTE — ED Notes (Signed)
ED staff unable to obtain blood specimen; lab at bedside now in attempt to collect blood.

## 2015-06-18 NOTE — ED Provider Notes (Signed)
[facility] Emergency Department Provider Note  ____________________________________________  Time seen: Approximately 1:39 PM  I have reviewed the triage vital signs and the nursing notes.   HISTORY  Chief Complaint Chest Pain    HPI Barbara Farley is a 70 y.o. female with hypertension, diabetes mellitus, coronary artery disease, chronic kidney disease, left BKA who presents for evaluation of chest pain, gradual onset last night, currently resolved, no modifying factors, a suspicious he was shortness of breath and nausea. The patient reports that last night she had significant chest pain which is nonradiating, not worsened with exertion but was associated with sweating and shortness of breath and nausea. She reports it happened to her several times last night. Currently she denies any chest pain. She denies any recent illness including no fevers, chills, vomiting, diarrhea. She reports that she would have taken nitroglycerin last night treat her chest pain however she had run out.   Past Medical History  Diagnosis Date  . Diabetes mellitus without complication ([place])   . Hypertension   . Seizures ([place])   . COPD (chronic obstructive pulmonary disease) ([place])   . CHF (congestive heart failure) ([place])     There are no active problems to display for this patient.   Past Surgical History  Procedure Laterality Date  . Below knee leg amputation      Current Outpatient Rx  Name  Route  Sig  Dispense  Refill  . amLODipine-atorvastatin (CADUET) 5-40 MG tablet   Oral   Take 1 tablet by mouth daily.         Marland Kitchen aspirin EC 81 MG tablet   Oral   Take 81 mg by mouth daily.         Marland Kitchen docusate sodium (COLACE) 100 MG capsule   Oral   Take 100 mg by mouth daily.         . furosemide (LASIX) 20 MG tablet   Oral   Take 60 mg by mouth every morning.         . isosorbide mononitrate (IMDUR) 30 MG 24 hr tablet   Oral   Take 30 mg by mouth daily.          Marland Kitchen lisinopril (PRINIVIL,ZESTRIL) 40 MG tablet   Oral   Take 40 mg by mouth daily.         Marland Kitchen oxybutynin (DITROPAN-XL) 10 MG 24 hr tablet   Oral   Take 10 mg by mouth daily.         Marland Kitchen PARoxetine (PAXIL) 20 MG tablet   Oral   Take 20 mg by mouth daily.         . ranitidine (ZANTAC) 150 MG tablet   Oral   Take 150 mg by mouth 2 (two) times daily.         Marland Kitchen senna (SENOKOT) 8.6 MG TABS tablet   Oral   Take 2 tablets by mouth at bedtime.         . Vitamin D, Ergocalciferol, (DRISDOL) 50000 units CAPS capsule   Oral   Take 50,000 Units by mouth every 30 (thirty) days. Take on the first Monday of each month           Allergies Review of patient's allergies indicates no known allergies.  No family history on file.  Social History Social History  Substance Use Topics  . Smoking status: Current Every Day Smoker  . Smokeless tobacco: None  . Alcohol Use: No    Review of  Systems Constitutional: No fever/chills Eyes: No visual changes. ENT: No sore throat. Cardiovascular: + chest pain. Respiratory: + shortness of breath. Gastrointestinal: No abdominal pain.  + nausea, no vomiting.  No diarrhea.  No constipation. Genitourinary: Negative for dysuria. Musculoskeletal: Negative for back pain. Skin: Negative for rash. Neurological: Negative for headaches, focal weakness or numbness.  10-point ROS otherwise negative.  ____________________________________________   PHYSICAL EXAM:  VITAL SIGNS: ED Triage Vitals  Enc Vitals Group     BP 06/18/15 1121 115/56 mmHg     Pulse Rate 06/18/15 1121 88     Resp 06/18/15 1237 17     Temp --      Temp src --      SpO2 06/18/15 1114 95 %     Weight 06/18/15 1121 313 lb (141.976 kg)     Height 06/18/15 1121 5\' 7"  (1.702 m)     Head Cir --      Peak Flow --      Pain Score 06/18/15 1123 0     Pain Loc --      Pain Edu? --      Excl. in [place]? --     Constitutional: Alert and oriented. Nontoxic appearing and in no  acute distress. Eyes: Conjunctivae are normal. PERRL. EOMI. Head: Atraumatic. Nose: No congestion/rhinnorhea. Mouth/Throat: Mucous membranes are moist.  Oropharynx non-erythematous. Neck: No stridor.  Cardiovascular: Normal rate, regular rhythm. Grossly normal heart sounds.  Good peripheral circulation. Respiratory: Normal respiratory effort.  No retractions. Lungs CTAB. Gastrointestinal: Soft and nontender. No distention. No CVA tenderness. Genitourinary: deferred Musculoskeletal: 2+ pitting edema in the right lower extremity. Status post BKA with prosthesis on the left. Neurologic:  Normal speech and language. No gross focal neurologic deficits are appreciated.  Skin:  Skin is warm, dry and intact. No rash noted. Psychiatric: Mood and affect are normal. Speech and behavior are normal.  ____________________________________________   LABS (all labs ordered are listed, but only abnormal results are displayed)  Labs Reviewed  BASIC METABOLIC PANEL - Abnormal; Notable for the following:    Glucose, Bld 136 (*)    BUN 30 (*)    Creatinine, Ser 1.96 (*)    GFR calc non Af Amer 25 (*)    GFR calc Af Amer 29 (*)    All other components within normal limits  CBC  TROPONIN I  BRAIN NATRIURETIC PEPTIDE   ____________________________________________  EKG  ED ECG REPORT I, Joanne Gavel, the attending physician, personally viewed and interpreted this ECG.   Date: 06/18/2015  EKG Time: 11:45  Rate: 86  Rhythm: normal sinus rhythm  Axis: normal  Intervals:none  ST&T Change: No acute ST elevation. LVH with repolarization abnormality.  ____________________________________________  RADIOLOGY  CXR IMPRESSION: 1. Abnormal bilateral interstitial accentuation possibly from interstitial edema. There is mild cardiomegaly. 2. Possible pulmonary nodule in the right mid lung. Given the patient's history of smoking, I recommend CT chest for further characterization. 3. No pleural  effusion identified. These results will be called to the ordering clinician or representative by the Radiologist Assistant, and communication documented in the PACS or zVision Dashboard. ____________________________________________   PROCEDURES  Procedure(s) performed: None  Critical Care performed: No  ____________________________________________   INITIAL IMPRESSION / ASSESSMENT AND PLAN / ED COURSE  Pertinent labs & imaging results that were available during my care of the patient were reviewed by me and considered in my medical decision making (see chart for details).  Barbara Farley is a 70 y.o.  female with hypertension, diabetes mellitus, coronary artery disease, chronic kidney disease, left BKA who presents for evaluation of chest pain, early chest pain. 3. On exam, she is well-appearing in no acute distress. Vital signs stable, she is afebrile. EKG negative for acute ischemia. First troponin is negative and EKG shows no acute evidence of ischemia however given her history of coronary artery disease, chest pain associated with sudden nausea and shortness of breath, I'm concerned that this could represent ACS. Additionally, chest x-rays concerning for interstitial edema, CHF could be contributing to her chest pain. We'll give Lasix. Aspirin ordered. Case discussed with hospitalist, Dr. Posey Pronto, for admission.  ____________________________________________   FINAL CLINICAL IMPRESSION(S) / ED DIAGNOSES  Final diagnoses:  Chest pain, unspecified chest pain type  Congestive heart failure, unspecified congestive heart failure chronicity, unspecified congestive heart failure type (HCC)      Joanne Gavel, MD 06/18/15 1507

## 2015-06-18 NOTE — ED Notes (Signed)
Pt asking questions such as, "do you think I have cancer?" and  "is I dying?"  Pt reports "sometimes I just don't feel right."

## 2015-06-18 NOTE — ED Notes (Signed)
Pt in via EMS w/ complaints of new onset chest pain since last night; pt reports waking up around 2145 from her sleep with right side chest pain and nausea.  Pt denies vomiting, dizziness, diaphoresis.  Pt reports chest pain through the night and this morning.  Pt PCP was called this morning and advised coming to ER.  Pt denies pain at this time.  Pt A/Ox4, vitals WDL, no immediate distress at this time.

## 2015-06-18 NOTE — ED Notes (Signed)
Ok for pt to eat per Dr. Edd Fabian.  Dietary called to replenish sandwich trays in ED.

## 2015-06-18 NOTE — ED Notes (Signed)
Patient transported to X-ray 

## 2015-06-18 NOTE — H&P (Signed)
[facility] [place] NAME: Barbara Farley    MR#:  [id number]  DATE OF BIRTH:  1946-02-24  DATE OF ADMISSION:  06/18/2015  PRIMARY CARE PHYSICIAN: Nile Dear, DO   REQUESTING/REFERRING PHYSICIAN: Joanne Gavel MD  CHIEF COMPLAINT:   Chief Complaint  Patient presents with  . Chest Pain    HISTORY OF PRESENT ILLNESS: Barbara Farley  is a 70 y.o. female with a known history of  hypertension, diabetes, chronic kidney disease, history of left BKA with history of congestive heart failure according to her. Also has listed history of coronary artery disease which she is not aware of. Who presents to the emergency room complaining of shortness of breath. Shortness of breath is progressively worse over the past few days. She reports gaining 3 pounds. She also complains of having chest pressure at rest and exertion. In the ER her troponin was negative EKG nonrevealing chest x-ray suggestive of congestive heart failure. Does not have any coughing or wheezing. She continues to smoke.  PAST MEDICAL HISTORY:   Past Medical History  Diagnosis Date  . Diabetes mellitus without complication ([place])   . Hypertension   . Seizures ([place])   . COPD (chronic obstructive pulmonary disease) ([place])   . CHF (congestive heart failure) ([place])   . CAD (coronary artery disease)   . Hyperlipemia   . Anemia   . Sleep apnea   . Morbid obesity ([place])     PAST SURGICAL HISTORY: Past Surgical History  Procedure Laterality Date  . Below knee leg amputation      SOCIAL HISTORY:  Social History  Substance Use Topics  . Smoking status: Current Every Day Smoker  . Smokeless tobacco: Not on file  . Alcohol Use: No    FAMILY HISTORY:  Family History  Problem Relation Age of Onset  . Diabetes      DRUG ALLERGIES: No Known Allergies  REVIEW OF SYSTEMS:   CONSTITUTIONAL: No fever, fatigue or weakness.  EYES: No blurred or double vision.  EARS, NOSE, AND  THROAT: No tinnitus or ear pain.  RESPIRATORY: No cough, positive shortness of breath, wheezing or hemoptysis.  CARDIOVASCULAR: Positive chest pain, positive orthopnea, positive edema.  GASTROINTESTINAL: No nausea, vomiting, diarrhea or abdominal pain.  GENITOURINARY: No dysuria, hematuria.  ENDOCRINE: No polyuria, nocturia,  HEMATOLOGY: No anemia, easy bruising or bleeding SKIN: No rash or lesion. MUSCULOSKELETAL: No joint pain or arthritis.   NEUROLOGIC: No tingling, numbness, weakness.  PSYCHIATRY: No anxiety or depression.   MEDICATIONS AT HOME:  Prior to Admission medications   Medication Sig Start Date End Date Taking? Authorizing Provider  amLODipine-atorvastatin (CADUET) 5-40 MG tablet Take 1 tablet by mouth daily.   Yes Historical Provider, MD  aspirin EC 81 MG tablet Take 81 mg by mouth daily.   Yes Historical Provider, MD  docusate sodium (COLACE) 100 MG capsule Take 100 mg by mouth daily.   Yes Historical Provider, MD  furosemide (LASIX) 20 MG tablet Take 60 mg by mouth every morning.   Yes Historical Provider, MD  isosorbide mononitrate (IMDUR) 30 MG 24 hr tablet Take 30 mg by mouth daily.   Yes Historical Provider, MD  lisinopril (PRINIVIL,ZESTRIL) 40 MG tablet Take 40 mg by mouth daily.   Yes Historical Provider, MD  oxybutynin (DITROPAN-XL) 10 MG 24 hr tablet Take 10 mg by mouth daily.   Yes Historical Provider, MD  PARoxetine (PAXIL) 20 MG tablet Take 20 mg by mouth daily.  Yes Historical Provider, MD  ranitidine (ZANTAC) 150 MG tablet Take 150 mg by mouth 2 (two) times daily.   Yes Historical Provider, MD  senna (SENOKOT) 8.6 MG TABS tablet Take 2 tablets by mouth at bedtime.   Yes Historical Provider, MD  Vitamin D, Ergocalciferol, (DRISDOL) 50000 units CAPS capsule Take 50,000 Units by mouth every 30 (thirty) days. Take on the first Monday of each month   Yes Historical Provider, MD      PHYSICAL EXAMINATION:   VITAL SIGNS: Blood pressure 151/73, pulse 100, resp.  rate 18, height 5\' 7"  (1.702 m), weight 141.976 kg (313 lb), SpO2 95 %.  GENERAL:  70 y.o.-year-old patient lying in the bed with no acute distress.  EYES: Pupils equal, round, reactive to light and accommodation. No scleral icterus. Extraocular muscles intact.  HEENT: Head atraumatic, normocephalic. Oropharynx and nasopharynx clear.  NECK:  Supple, no jugular venous distention. No thyroid enlargement, no tenderness.  LUNGS: Occasional crackles  CARDIOVASCULAR: S1, S2 normal. No murmurs, rubs, or gallops.  ABDOMEN: Soft, nontender, nondistended. Bowel sounds present. No organomegaly or mass.  EXTREMITIES: 1+ pedal edema, cyanosis, or clubbing.  NEUROLOGIC: Cranial nerves II through XII are intact. Muscle strength 5/5 in all extremities. Sensation intact. Gait not checked.  PSYCHIATRIC: The patient is alert and oriented x 3.  SKIN: No obvious rash, lesion, or ulcer.   LABORATORY PANEL:   CBC  Recent Labs Lab 06/18/15 1401  WBC 9.3  HGB 13.0  HCT 40.6  PLT 176  MCV 88.1  MCH 28.3  MCHC 32.1  RDW 13.7   ------------------------------------------------------------------------------------------------------------------  Chemistries   Recent Labs Lab 06/18/15 1401  NA 138  K 5.1  CL 105  CO2 24  GLUCOSE 136*  BUN 30*  CREATININE 1.96*  CALCIUM 9.3   ------------------------------------------------------------------------------------------------------------------ estimated creatinine clearance is 41.2 mL/min (by C-G formula based on Cr of 1.96). ------------------------------------------------------------------------------------------------------------------ No results for input(s): TSH, T4TOTAL, T3FREE, THYROIDAB in the last 72 hours.  Invalid input(s): FREET3   Coagulation profile No results for input(s): INR, PROTIME in the last 168 hours. ------------------------------------------------------------------------------------------------------------------- No results  for input(s): DDIMER in the last 72 hours. -------------------------------------------------------------------------------------------------------------------  Cardiac Enzymes  Recent Labs Lab 06/18/15 1401  TROPONINI <0.03   ------------------------------------------------------------------------------------------------------------------ Invalid input(s): POCBNP  ---------------------------------------------------------------------------------------------------------------  Urinalysis    Component Value Date/Time   COLORURINE YELLOW* 05/29/2015 1455   COLORURINE Yellow 05/17/2013 2200   APPEARANCEUR CLOUDY* 05/29/2015 1455   APPEARANCEUR Cloudy 05/17/2013 2200   LABSPEC 1.008 05/29/2015 1455   LABSPEC 1.010 05/17/2013 2200   PHURINE 5.0 05/29/2015 1455   PHURINE 5.0 05/17/2013 2200   GLUCOSEU >500* 05/29/2015 1455   GLUCOSEU >=500 05/17/2013 2200   HGBUR 1+* 05/29/2015 1455   HGBUR 2+ 05/17/2013 2200   BILIRUBINUR NEGATIVE 05/29/2015 1455   BILIRUBINUR Negative 05/17/2013 2200   KETONESUR NEGATIVE 05/29/2015 1455   KETONESUR Negative 05/17/2013 2200   PROTEINUR 30* 05/29/2015 1455   PROTEINUR Negative 05/17/2013 2200   NITRITE NEGATIVE 05/29/2015 1455   NITRITE Negative 05/17/2013 2200   LEUKOCYTESUR 3+* 05/29/2015 1455   LEUKOCYTESUR 1+ 05/17/2013 2200     RADIOLOGY: Dg Chest 2 View  06/18/2015  CLINICAL DATA:  Right-sided chest pain starting last night. Bronchitis and asthma. COPD. EXAM: CHEST  2 VIEW COMPARISON:  05/17/2013 FINDINGS: Mild enlargement of the cardiopericardial silhouette with bilateral interstitial accentuation. Questionable 1.8 cm nodular density in the right mid lung, very ill-defined, possibly chronic as there is a similar vague density on 05/17/2013. I do not  see a definite corresponding density on the lateral projection. Body habitus reduces diagnostic sensitivity and specificity. IMPRESSION: 1. Abnormal bilateral interstitial accentuation possibly  from interstitial edema. There is mild cardiomegaly. 2. Possible pulmonary nodule in the right mid lung. Given the patient's history of smoking, I recommend CT chest for further characterization. 3. No pleural effusion identified. These results will be called to the ordering clinician or representative by the Radiologist Assistant, and communication documented in the PACS or zVision Dashboard. Electronically Signed   By: Van Clines M.D.   On: 06/18/2015 12:19    EKG: Orders placed or performed during the hospital encounter of 06/18/15  . ED EKG within 10 minutes  . ED EKG within 10 minutes  . EKG 12-Lead  . EKG 12-Lead    IMPRESSION AND PLAN: Patient is a 70 year old African-American female presents with chest pressure and shortness of breath  1. Acute CHF exasperation type unknown: Treat with IV Lasix, will obtain echo Carter gram of the heart  2. Acute renal failure on chronic kidney disease: Due to fluid overload we'll give her IV Lasix hold ACE inhibitor for now  3. Diabetes type 2: Place her on sliding scale check a hemoglobin A1c  4. Hypertension continue amlodipine  5. Sleep apnea noncompliant with her CPAP machine  6. Hyperlipidemia continue atorvastatin  7. Miscellaneous heparin for DVT prophylaxis    All the records are reviewed and case discussed with ED provider. Management plans discussed with the patient, family and they are in agreement.  CODE STATUS:    Code Status Orders        Start     Ordered   06/18/15 1517  Full code   Continuous     06/18/15 1518    Code Status History    Date Active Date Inactive Code Status Order ID Comments User Context   This patient has a current code status but no historical code status.    Advance Directive Documentation        Most Recent Value   Type of Advance Directive  Healthcare Power of Attorney, Living will   Pre-existing out of facility DNR order (yellow form or pink MOST form)     "MOST" Form in  Place?         TOTAL TIME TAKING CARE OF THIS PATIENT: 55 minutes.    Dustin Flock M.D on 06/18/2015 at 3:22 PM  Between 7am to 6pm - Pager - [phone]  After 6pm go to [email or web address] - password [email or web address]  Office  [phone]  CC: Primary care physician; Nile Dear, DO

## 2015-06-18 NOTE — Progress Notes (Signed)
Dr. Darvin Neighbours aware of pt's troponin level of 0.04, no new orders received.

## 2015-06-19 LAB — CBC
HCT: 34.4 % — ABNORMAL LOW (ref 35.0–47.0)
Hemoglobin: 11 g/dL — ABNORMAL LOW (ref 12.0–16.0)
MCH: 27.9 pg (ref 26.0–34.0)
MCHC: 31.9 g/dL — ABNORMAL LOW (ref 32.0–36.0)
MCV: 87.3 fL (ref 80.0–100.0)
PLATELETS: 151 10*3/uL (ref 150–440)
RBC: 3.93 MIL/uL (ref 3.80–5.20)
RDW: 14 % (ref 11.5–14.5)
WBC: 7.5 10*3/uL (ref 3.6–11.0)

## 2015-06-19 LAB — GLUCOSE, CAPILLARY
Glucose-Capillary: 305 mg/dL — ABNORMAL HIGH (ref 65–99)
Glucose-Capillary: 348 mg/dL — ABNORMAL HIGH (ref 65–99)

## 2015-06-19 LAB — BASIC METABOLIC PANEL
ANION GAP: 6 (ref 5–15)
BUN: 33 mg/dL — ABNORMAL HIGH (ref 6–20)
CALCIUM: 8.7 mg/dL — AB (ref 8.9–10.3)
CO2: 28 mmol/L (ref 22–32)
Chloride: 103 mmol/L (ref 101–111)
Creatinine, Ser: 2.11 mg/dL — ABNORMAL HIGH (ref 0.44–1.00)
GFR, EST AFRICAN AMERICAN: 27 mL/min — AB (ref >60)
GFR, EST NON AFRICAN AMERICAN: 23 mL/min — AB (ref >60)
Glucose, Bld: 341 mg/dL — ABNORMAL HIGH (ref 65–99)
Potassium: 4.7 mmol/L (ref 3.5–5.1)
Sodium: 137 mmol/L (ref 135–145)

## 2015-06-19 LAB — HEMOGLOBIN A1C: HEMOGLOBIN A1C: 8 % — AB (ref 4.0–6.0)

## 2015-06-19 LAB — TROPONIN I: Troponin I: 0.03 ng/mL (ref <0.031)

## 2015-06-19 MED ORDER — ENOXAPARIN SODIUM 40 MG/0.4ML ~~LOC~~ SOLN: 40.0000 mg | SUBCUTANEOUS | Status: DC

## 2015-06-19 NOTE — H&P (Signed)
Barbara Farley is a 70 y.o. female  [id number]  Primary Cardiologist: Neoma Laming Reason for Consultation: CHF  HPI: 70 year old African-American female with a past medical history hypertension diabetes renal insufficiency history of left below knee amputation presented to the hospital with chest pain and shortness of breath chest pain was pressure type on the right side associated with coughing. Chest x-ray had congestive heart failure changes and was given IV Lasix which has significantly improved her symptoms. Still has some chest pain but  no shortness of breath.   Review of Systems: Does have orthopnea PND and leg swelling and gain of 3 pounds in the past couple of days.   Past Medical History  Diagnosis Date  . Diabetes mellitus without complication ([place])   . Hypertension   . Seizures ([place])   . COPD (chronic obstructive pulmonary disease) ([place])   . CHF (congestive heart failure) ([place])   . CAD (coronary artery disease)   . Hyperlipemia   . Anemia   . Sleep apnea   . Morbid obesity ([place])     Medications Prior to Admission  Medication Sig Dispense Refill  . amLODipine-atorvastatin (CADUET) 5-40 MG tablet Take 1 tablet by mouth daily.    Marland Kitchen aspirin EC 81 MG tablet Take 81 mg by mouth daily.    Marland Kitchen docusate sodium (COLACE) 100 MG capsule Take 100 mg by mouth daily.    . furosemide (LASIX) 20 MG tablet Take 60 mg by mouth every morning.    . isosorbide mononitrate (IMDUR) 30 MG 24 hr tablet Take 30 mg by mouth daily.    Marland Kitchen lisinopril (PRINIVIL,ZESTRIL) 40 MG tablet Take 40 mg by mouth daily.    Marland Kitchen oxybutynin (DITROPAN-XL) 10 MG 24 hr tablet Take 10 mg by mouth daily.    Marland Kitchen PARoxetine (PAXIL) 20 MG tablet Take 20 mg by mouth daily.    . ranitidine (ZANTAC) 150 MG tablet Take 150 mg by mouth 2 (two) times daily.    Marland Kitchen senna (SENOKOT) 8.6 MG TABS tablet Take 2 tablets by mouth at bedtime.    . Vitamin D, Ergocalciferol, (DRISDOL) 50000 units CAPS capsule Take 50,000 Units by mouth  every 30 (thirty) days. Take on the first Monday of each month       . aspirin EC  81 mg Oral Daily  . atorvastatin  40 mg Oral q1800  . docusate sodium  100 mg Oral Daily  . enoxaparin (LOVENOX) injection  40 mg Subcutaneous Q24H  . famotidine  20 mg Oral BID  . insulin aspart  0-5 Units Subcutaneous QHS  . insulin aspart  0-9 Units Subcutaneous TID WC  . oxybutynin  10 mg Oral Daily  . PARoxetine  20 mg Oral Daily  . senna  2 tablet Oral QHS  . sodium chloride flush  3 mL Intravenous Q12H  . sodium chloride flush  3 mL Intravenous Q12H    Infusions:    No Known Allergies  Social History   Social History  . Marital Status: Widowed    Spouse Name: N/A  . Number of Children: N/A  . Years of Education: N/A   Occupational History  . Not on file.   Social History Main Topics  . Smoking status: Current Every Day Smoker  . Smokeless tobacco: Not on file  . Alcohol Use: No  . Drug Use: No  . Sexual Activity: No   Other Topics Concern  . Not on file   Social History Narrative  Family History  Problem Relation Age of Onset  . Diabetes      PHYSICAL EXAM: Filed Vitals:   06/19/15 0843 06/19/15 1200  BP: 98/57 133/77  Pulse: 110 100  Temp:  98 F (36.7 C)  Resp:       Intake/Output Summary (Last 24 hours) at 06/19/15 1229 Last data filed at 06/19/15 0830  Gross per 24 hour  Intake      0 ml  Output      0 ml  Net      0 ml    General:  Well appearing. No respiratory difficulty HEENT: normal Neck: supple. no JVD. Carotids 2+ bilat; no bruits. No lymphadenopathy or thryomegaly appreciated. Cor: PMI nondisplaced. Regular rate & rhythm. No rubs, gallops or murmurs. Lungs: clear Abdomen: soft, nontender, nondistended. No hepatosplenomegaly. No bruits or masses. Good bowel sounds. Extremities: no cyanosis, clubbing, rash, edema Neuro: alert & oriented x 3, cranial nerves grossly intact. moves all 4 extremities w/o difficulty. Affect pleasant.  ECG:  Sinus rhythm 86 bpm with nonspecific intraventricular conduction delay  Results for orders placed or performed during the hospital encounter of 06/18/15 (from the past 24 hour(s))  Basic metabolic panel     Status: Abnormal   Collection Time: 06/18/15  2:01 PM  Result Value Ref Range   Sodium 138 135 - 145 mmol/L   Potassium 5.1 3.5 - 5.1 mmol/L   Chloride 105 101 - 111 mmol/L   CO2 24 22 - 32 mmol/L   Glucose, Bld 136 (H) 65 - 99 mg/dL   BUN 30 (H) 6 - 20 mg/dL   Creatinine, Ser 1.96 (H) 0.44 - 1.00 mg/dL   Calcium 9.3 8.9 - 10.3 mg/dL   GFR calc non Af Amer 25 (L) >60 mL/min   GFR calc Af Amer 29 (L) >60 mL/min   Anion gap 9 5 - 15  CBC     Status: None   Collection Time: 06/18/15  2:01 PM  Result Value Ref Range   WBC 9.3 3.6 - 11.0 K/uL   RBC 4.60 3.80 - 5.20 MIL/uL   Hemoglobin 13.0 12.0 - 16.0 g/dL   HCT 40.6 35.0 - 47.0 %   MCV 88.1 80.0 - 100.0 fL   MCH 28.3 26.0 - 34.0 pg   MCHC 32.1 32.0 - 36.0 g/dL   RDW 13.7 11.5 - 14.5 %   Platelets 176 150 - 440 K/uL  Troponin I     Status: None   Collection Time: 06/18/15  2:01 PM  Result Value Ref Range   Troponin I <0.03 <0.031 ng/mL  Brain natriuretic peptide     Status: None   Collection Time: 06/18/15  2:01 PM  Result Value Ref Range   B Natriuretic Peptide 33.0 0.0 - 100.0 pg/mL  Hemoglobin A1c     Status: Abnormal   Collection Time: 06/18/15  2:01 PM  Result Value Ref Range   Hgb A1c MFr Bld 8.0 (H) 4.0 - 6.0 %  TSH     Status: None   Collection Time: 06/18/15  3:48 PM  Result Value Ref Range   TSH 0.908 0.350 - 4.500 uIU/mL  Troponin I     Status: None   Collection Time: 06/18/15  3:48 PM  Result Value Ref Range   Troponin I <0.03 <0.031 ng/mL  Glucose, capillary     Status: Abnormal   Collection Time: 06/18/15  4:48 PM  Result Value Ref Range   Glucose-Capillary 244 (H) 65 - 99  mg/dL  Troponin I     Status: Abnormal   Collection Time: 06/18/15  9:19 PM  Result Value Ref Range   Troponin I 0.04 (H) <0.031  ng/mL  Glucose, capillary     Status: Abnormal   Collection Time: 06/18/15  9:25 PM  Result Value Ref Range   Glucose-Capillary 347 (H) 65 - 99 mg/dL  Troponin I     Status: None   Collection Time: 06/19/15  3:37 AM  Result Value Ref Range   Troponin I 0.03 <0.031 ng/mL  CBC     Status: Abnormal   Collection Time: 06/19/15  3:37 AM  Result Value Ref Range   WBC 7.5 3.6 - 11.0 K/uL   RBC 3.93 3.80 - 5.20 MIL/uL   Hemoglobin 11.0 (L) 12.0 - 16.0 g/dL   HCT 34.4 (L) 35.0 - 47.0 %   MCV 87.3 80.0 - 100.0 fL   MCH 27.9 26.0 - 34.0 pg   MCHC 31.9 (L) 32.0 - 36.0 g/dL   RDW 14.0 11.5 - 14.5 %   Platelets 151 150 - 440 K/uL  Basic metabolic panel     Status: Abnormal   Collection Time: 06/19/15  3:37 AM  Result Value Ref Range   Sodium 137 135 - 145 mmol/L   Potassium 4.7 3.5 - 5.1 mmol/L   Chloride 103 101 - 111 mmol/L   CO2 28 22 - 32 mmol/L   Glucose, Bld 341 (H) 65 - 99 mg/dL   BUN 33 (H) 6 - 20 mg/dL   Creatinine, Ser 2.11 (H) 0.44 - 1.00 mg/dL   Calcium 8.7 (L) 8.9 - 10.3 mg/dL   GFR calc non Af Amer 23 (L) >60 mL/min   GFR calc Af Amer 27 (L) >60 mL/min   Anion gap 6 5 - 15  Glucose, capillary     Status: Abnormal   Collection Time: 06/19/15  7:41 AM  Result Value Ref Range   Glucose-Capillary 305 (H) 65 - 99 mg/dL  Glucose, capillary     Status: Abnormal   Collection Time: 06/19/15 11:44 AM  Result Value Ref Range   Glucose-Capillary 348 (H) 65 - 99 mg/dL   Dg Chest 2 View  06/18/2015  CLINICAL DATA:  Right-sided chest pain starting last night. Bronchitis and asthma. COPD. EXAM: CHEST  2 VIEW COMPARISON:  05/17/2013 FINDINGS: Mild enlargement of the cardiopericardial silhouette with bilateral interstitial accentuation. Questionable 1.8 cm nodular density in the right mid lung, very ill-defined, possibly chronic as there is a similar vague density on 05/17/2013. I do not see a definite corresponding density on the lateral projection. Body habitus reduces diagnostic  sensitivity and specificity. IMPRESSION: 1. Abnormal bilateral interstitial accentuation possibly from interstitial edema. There is mild cardiomegaly. 2. Possible pulmonary nodule in the right mid lung. Given the patient's history of smoking, I recommend CT chest for further characterization. 3. No pleural effusion identified. These results will be called to the ordering clinician or representative by the Radiologist Assistant, and communication documented in the PACS or zVision Dashboard. Electronically Signed   By: Van Clines M.D.   On: 06/18/2015 12:19     ASSESSMENT AND PLAN: Congestive heart failure most likely due to LV dysfunction as echo was not done while she was in the hospital. Patient is about to be discharged feeling much better though less short of breath but does have a right-sided chest pain. EKG is unremarkable only had mildly elevated 1 troponin which is probably due to demand ischemia. Advise outpatient  Lexiscan Myoview in the office. Patient was given appointment to be seen next week Thursday at 2 PM.  KHAN,SHAUKAT A

## 2015-06-19 NOTE — Progress Notes (Signed)
PHARMACIST - PHYSICIAN COMMUNICATION  CONCERNING:  Enoxaparin    RECOMMENDATION:  Patient was started on Enoxaparin 30mg  q24 hours. Patient's BMI >40 and CrCl is >49ml/min, however Scr has increased to 2.11. Recommended dosing for BMI >40 amd CrCl >32ml/min is enoxaparin 40mg  q12 hours.   Due to Scr increase will dose enoxaparin  40mg  q24 hours.    Nancy Fetter, PharmD Pharmacy Resident

## 2015-06-19 NOTE — Progress Notes (Signed)
Blood pressure 98/57, Dr. Posey Pronto notified. Ordered to hold imdur and amlodipine. Barbara Farley

## 2015-06-19 NOTE — Progress Notes (Signed)
Pt alert and oriented x4, no complaints of pain or discomfort.  Bed in low position, call bell within reach.  Bed alarms on and functioning.    Will continue to monitor and do hourly rounding throughout the shift.\ Pain noted 0/10.

## 2015-06-19 NOTE — Discharge Instructions (Signed)
°  DIET:  Cardiac diet, carbohydrate consistent diet3  DISCHARGE CONDITION:  Stable  ACTIVITY:  Activity as tolerated  OXYGEN:  Home Oxygen: No.   Oxygen Delivery: room air  DISCHARGE LOCATION:  home    ADDITIONAL DISCHARGE INSTRUCTION: Dont take any medications at home today except your insulin   If you experience worsening of your admission symptoms, develop shortness of breath, life threatening emergency, suicidal or homicidal thoughts you must seek medical attention immediately by calling 911 or calling your MD immediately  if symptoms less severe.  You Must read complete instructions/literature along with all the possible adverse reactions/side effects for all the Medicines you take and that have been prescribed to you. Take any new Medicines after you have completely understood and accpet all the possible adverse reactions/side effects.   Please note  You were cared for by a hospitalist during your hospital stay. If you have any questions about your discharge medications or the care you received while you were in the hospital after you are discharged, you can call the unit and asked to speak with the hospitalist on call if the hospitalist that took care of you is not available. Once you are discharged, your primary care physician will handle any further medical issues. Please note that NO REFILLS for any discharge medications will be authorized once you are discharged, as it is imperative that you return to your primary care physician (or establish a relationship with a primary care physician if you do not have one) for your aftercare needs so that they can reassess your need for medications and monitor your lab values.

## 2015-06-19 NOTE — Progress Notes (Signed)
Patient request to go by EMS, SW notified. Discharge completed, report given to oncoming nurse. Barbara Farley

## 2015-06-19 NOTE — Progress Notes (Signed)
Discharge: Pt d/c from room stretcher via EMS. Discharge instructions given to the patient.  No questions from pt, reintegrated to the pt to call or go to the ED for chest discomfort. Pt dressed in street clothes and left with discharge papers  IV d/ced, tele removed and no complaints of pain or discomfort.

## 2015-06-19 NOTE — Discharge Summary (Signed)
Barbara Farley, 70 y.o., DOB April 18, 1946, MRN [id number]. Admission date: 06/18/2015 Discharge Date 06/19/2015 Primary MD Nile Dear, DO Admitting Physician Dustin Flock, MD  Admission Diagnosis  Chest pain, unspecified chest pain type [R07.9] Congestive heart failure, unspecified congestive heart failure chronicity, unspecified congestive heart failure type ([place]) [I50.9]  Discharge Diagnosis   Active Problems:  Acute CHF type unknown Elevated troponin due to demand ischemia Diabetes poorly controlled Hypertension Seizure COPD CAD CKD stage 3 Hyperlipidemia Chronic anemia Sleep apnea Morbid obesity     Hospital Course  70 year old African-American female with a past medical history hypertension diabetes renal insufficiency history of left below knee amputation presented to the hospital with chest pain and shortness of breath chest pain was pressure type on the right side associated with coughing. Chest x-ray had congestive heart failure changes and was given IV Lasix with significant improvement in her symptoms. She is feeling much better very anxious to go home. Patient did have elevated troponin. She was seen by cardiology and will have outpatient stress test. She is also a diabetic but unable to tell me what insulin she takes but she will restart that at home. Currently feeling much better and is stable for discharge to home.            Consults  cardiology  Significant Tests:  See full reports for all details      Dg Chest 2 View  06/18/2015  CLINICAL DATA:  Right-sided chest pain starting last night. Bronchitis and asthma. COPD. EXAM: CHEST  2 VIEW COMPARISON:  05/17/2013 FINDINGS: Mild enlargement of the cardiopericardial silhouette with bilateral interstitial accentuation. Questionable 1.8 cm nodular density in the right mid lung, very ill-defined, possibly chronic as there is a similar vague density on 05/17/2013. I do not see a definite corresponding density on  the lateral projection. Body habitus reduces diagnostic sensitivity and specificity. IMPRESSION: 1. Abnormal bilateral interstitial accentuation possibly from interstitial edema. There is mild cardiomegaly. 2. Possible pulmonary nodule in the right mid lung. Given the patient's history of smoking, I recommend CT chest for further characterization. 3. No pleural effusion identified. These results will be called to the ordering clinician or representative by the Radiologist Assistant, and communication documented in the PACS or zVision Dashboard. Electronically Signed   By: Van Clines M.D.   On: 06/18/2015 12:19   Ct Renal Stone Study  05/29/2015  CLINICAL DATA:  Bilateral flank pain EXAM: CT ABDOMEN AND PELVIS WITHOUT CONTRAST TECHNIQUE: Multidetector CT imaging of the abdomen and pelvis was performed following the standard protocol without IV contrast. COMPARISON:  None. FINDINGS: Lung bases are free of acute infiltrate or sizable effusion. A focus of subpleural fat is noted laterally on the left. Some associated scarring is noted. The liver, spleen, right adrenal gland and pancreas are within normal limits. Gallbladder is well distended with multiple small stones within. 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 Kidneys are well visualized bilaterally and reveal cystic change on the right. No calculi or obstructive changes are noted. Mild aortoiliac calcifications are seen. The appendix is within normal limits. Fat containing umbilical hernia is noted without incarceration. Bladder is well distended. The uterus has been surgically removed. Stable calcification is noted in the right hemipelvis which appears benign in etiology. The bony structures are within normal limits. IMPRESSION: Cholelithiasis without complicating factors. 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 Renal cystic change. Stable benign calcification in the right hemipelvis. No acute  abnormality is noted. Electronically Signed   By: Inez Catalina M.D.   On: 05/29/2015 14:49       Today   Subjective:   Barbara Farley  feels better shortness of breath resolved very anxious to go home  Objective:   Blood pressure 133/77, pulse 100, temperature 98 F (36.7 C), temperature source Axillary, resp. rate 16, height 5\' 7"  (1.702 m), weight 138.438 kg (305 lb 3.2 oz), SpO2 96 %.  .  Intake/Output Summary (Last 24 hours) at 06/19/15 1235 Last data filed at 06/19/15 0830  Gross per 24 hour  Intake      0 ml  Output      0 ml  Net      0 ml    Exam VITAL SIGNS: Blood pressure 133/77, pulse 100, temperature 98 F (36.7 C), temperature source Axillary, resp. rate 16, height 5\' 7"  (1.702 m), weight 138.438 kg (305 lb 3.2 oz), SpO2 96 %.  GENERAL:  70 y.o.-year-old patient lying in the bed with no acute distress.  EYES: Pupils equal, round, reactive to light and accommodation. No scleral icterus. Extraocular muscles intact.  HEENT: Head atraumatic, normocephalic. Oropharynx and nasopharynx clear.  NECK:  Supple, no jugular venous distention. No thyroid enlargement, no tenderness.  LUNGS: Normal breath sounds bilaterally, no wheezing, rales,rhonchi or crepitation. No use of accessory muscles of respiration.  CARDIOVASCULAR: S1, S2 normal. No murmurs, rubs, or gallops.  ABDOMEN: Soft, nontender, nondistended. Bowel sounds present. No organomegaly or mass.  EXTREMITIES: No pedal edema, cyanosis, or clubbing.  NEUROLOGIC: Cranial nerves II through XII are intact. Muscle strength 5/5 in all extremities. Sensation intact. Gait not checked.  PSYCHIATRIC: The patient is alert and oriented x 3.  SKIN: No obvious rash, lesion, or ulcer.   Data Review     CBC w Diff: Lab Results  Component Value Date   WBC 7.5 06/19/2015   WBC 10.8 05/19/2013   HGB 11.0* 06/19/2015   HGB 11.8* 05/19/2013   HCT 34.4* 06/19/2015   HCT 36.1 05/19/2013   PLT 151 06/19/2015   PLT 182 05/19/2013    LYMPHOPCT 19 05/29/2015   LYMPHOPCT 19.7 05/19/2013   MONOPCT 8 05/29/2015   MONOPCT 6.3 05/19/2013   EOSPCT 1 05/29/2015   EOSPCT 2.5 05/19/2013   BASOPCT 1 05/29/2015   BASOPCT 0.9 05/19/2013   CMP: Lab Results  Component Value Date   NA 137 06/19/2015   NA 136 05/19/2013   K 4.7 06/19/2015   K 4.3 05/19/2013   CL 103 06/19/2015   CL 104 05/19/2013   CO2 28 06/19/2015   CO2 27 05/19/2013   BUN 33* 06/19/2015   BUN 21* 05/19/2013   CREATININE 2.11* 06/19/2015   CREATININE 1.41* 05/19/2013   PROT 7.4 05/29/2015   PROT 7.3 05/17/2013   ALBUMIN 3.8 05/29/2015   ALBUMIN 3.4 05/17/2013   BILITOT 0.8 05/29/2015   BILITOT 0.5 05/17/2013   ALKPHOS 110 05/29/2015   ALKPHOS 134* 05/17/2013   AST 18 05/29/2015   AST 14* 05/17/2013   ALT 21 05/29/2015   ALT 22 05/17/2013  .  Micro Results No results found for this or any previous visit (from the past 240 hour(s)).      Code Status Orders        Start     Ordered   06/18/15 1517  Full code   Continuous     06/18/15 1518    Code Status History    Date Active Date Inactive Code Status  Order ID Comments User Context   This patient has a current code status but no historical code status.    Advance Directive Documentation        Most Recent Value   Type of Advance Directive  Healthcare Power of Attorney, Living will   Pre-existing out of facility DNR order (yellow form or [place] MOST form)     "MOST" Form in Place?            Follow-up Information    Follow up with [facility], MD In 3 days.   Specialty:  Cardiology   Contact information:   [address] [place] [address] [phone]       Follow up with Nile Dear, DO In 4 days.   Specialty:  Geriatric Medicine   Contact information:   [address] [place] [address] [phone]       Discharge Medications     Medication List    STOP taking these medications        lisinopril 40 MG tablet  Commonly known as:   PRINIVIL,ZESTRIL      TAKE these medications        amLODipine-atorvastatin 5-40 MG tablet  Commonly known as:  CADUET  Take 1 tablet by mouth daily.     aspirin EC 81 MG tablet  Take 81 mg by mouth daily.     docusate sodium 100 MG capsule  Commonly known as:  COLACE  Take 100 mg by mouth daily.     furosemide 20 MG tablet  Commonly known as:  LASIX  Take 60 mg by mouth every morning.     isosorbide mononitrate 30 MG 24 hr tablet  Commonly known as:  IMDUR  Take 30 mg by mouth daily.     oxybutynin 10 MG 24 hr tablet  Commonly known as:  DITROPAN-XL  Take 10 mg by mouth daily.     PARoxetine 20 MG tablet  Commonly known as:  PAXIL  Take 20 mg by mouth daily.     ranitidine 150 MG tablet  Commonly known as:  ZANTAC  Take 150 mg by mouth 2 (two) times daily.     senna 8.6 MG Tabs tablet  Commonly known as:  SENOKOT  Take 2 tablets by mouth at bedtime.     Vitamin D (Ergocalciferol) 50000 units Caps capsule  Commonly known as:  DRISDOL  Take 50,000 Units by mouth every 30 (thirty) days. Take on the first Monday of each month           Total Time in preparing paper work, data evaluation and todays exam - 35 minutes  Dustin Flock M.D on 06/19/2015 at 12:35 PM  [facility]   Office  [phone]

## 2015-06-22 ENCOUNTER — Other Ambulatory Visit: Payer: Self-pay | Admitting: Geriatric Medicine

## 2015-06-22 ENCOUNTER — Other Ambulatory Visit (HOSPITAL_COMMUNITY): Payer: Self-pay | Admitting: Geriatric Medicine

## 2015-06-22 DIAGNOSIS — R911 Solitary pulmonary nodule: Secondary | ICD-10-CM

## 2015-07-13 ENCOUNTER — Ambulatory Visit
Admission: RE | Admit: 2015-07-13 | Discharge: 2015-07-13 | Disposition: A | Discharge status: Home / Self Care | Payer: Medicare (Managed Care) | Source: Ambulatory Visit | Attending: Geriatric Medicine | Admitting: Geriatric Medicine

## 2015-07-13 DIAGNOSIS — R911 Solitary pulmonary nodule: Secondary | ICD-10-CM | POA: Diagnosis not present

## 2015-07-13 DIAGNOSIS — I700 Atherosclerosis of aorta: Secondary | ICD-10-CM | POA: Insufficient documentation

## 2015-07-15 ENCOUNTER — Other Ambulatory Visit: Payer: Self-pay | Admitting: Geriatric Medicine

## 2015-07-15 DIAGNOSIS — R911 Solitary pulmonary nodule: Secondary | ICD-10-CM

## 2015-07-21 DIAGNOSIS — R918 Other nonspecific abnormal finding of lung field: Secondary | ICD-10-CM | POA: Insufficient documentation

## 2015-07-26 ENCOUNTER — Other Ambulatory Visit: Payer: Self-pay | Admitting: Geriatric Medicine

## 2015-07-26 DIAGNOSIS — Z0000 Encounter for general adult medical examination without abnormal findings: Secondary | ICD-10-CM

## 2015-07-30 DIAGNOSIS — R079 Chest pain, unspecified: Secondary | ICD-10-CM | POA: Insufficient documentation

## 2015-07-30 DIAGNOSIS — R0602 Shortness of breath: Secondary | ICD-10-CM | POA: Insufficient documentation

## 2015-08-03 ENCOUNTER — Encounter
Admission: RE | Admit: 2015-08-03 | Discharge: 2015-08-03 | Disposition: A | Discharge status: Home / Self Care | Payer: Medicare (Managed Care) | Source: Ambulatory Visit | Attending: Geriatric Medicine | Admitting: Geriatric Medicine

## 2015-08-03 DIAGNOSIS — R911 Solitary pulmonary nodule: Secondary | ICD-10-CM | POA: Diagnosis present

## 2015-08-03 LAB — GLUCOSE, CAPILLARY
GLUCOSE-CAPILLARY: 168 mg/dL — AB (ref 65–99)
Glucose-Capillary: 156 mg/dL — ABNORMAL HIGH (ref 65–99)

## 2015-08-03 MED ORDER — FLUDEOXYGLUCOSE F - 18 (FDG) INJECTION
13.1400 | Freq: Once | INTRAVENOUS | Status: AC | PRN
  Administered 2015-08-03: 13.14 via INTRAVENOUS

## 2015-08-05 ENCOUNTER — Other Ambulatory Visit: Payer: Self-pay | Admitting: Geriatric Medicine

## 2015-08-05 DIAGNOSIS — R911 Solitary pulmonary nodule: Secondary | ICD-10-CM

## 2015-10-14 ENCOUNTER — Ambulatory Visit
Admission: RE | Admit: 2015-10-14 | Discharge: 2015-10-14 | Disposition: A | Discharge status: Home / Self Care | Payer: Medicare (Managed Care) | Source: Ambulatory Visit | Attending: Geriatric Medicine | Admitting: Geriatric Medicine

## 2015-10-14 DIAGNOSIS — I517 Cardiomegaly: Secondary | ICD-10-CM | POA: Diagnosis not present

## 2015-10-14 DIAGNOSIS — I2510 Atherosclerotic heart disease of native coronary artery without angina pectoris: Secondary | ICD-10-CM | POA: Insufficient documentation

## 2015-10-14 DIAGNOSIS — E279 Disorder of adrenal gland, unspecified: Secondary | ICD-10-CM | POA: Diagnosis not present

## 2015-10-14 DIAGNOSIS — R911 Solitary pulmonary nodule: Secondary | ICD-10-CM | POA: Insufficient documentation

## 2015-10-14 DIAGNOSIS — R918 Other nonspecific abnormal finding of lung field: Secondary | ICD-10-CM | POA: Insufficient documentation

## 2015-10-20 ENCOUNTER — Other Ambulatory Visit: Payer: Self-pay | Admitting: Specialist

## 2015-10-20 DIAGNOSIS — R918 Other nonspecific abnormal finding of lung field: Secondary | ICD-10-CM

## 2015-11-04 ENCOUNTER — Other Ambulatory Visit: Payer: Self-pay | Admitting: Family Medicine

## 2015-11-04 DIAGNOSIS — R911 Solitary pulmonary nodule: Secondary | ICD-10-CM

## 2015-11-30 ENCOUNTER — Ambulatory Visit: Payer: Medicare (Managed Care)

## 2015-12-21 ENCOUNTER — Ambulatory Visit: Payer: Medicare (Managed Care) | Attending: Specialist

## 2015-12-21 DIAGNOSIS — R0683 Snoring: Secondary | ICD-10-CM | POA: Diagnosis present

## 2015-12-21 DIAGNOSIS — G4733 Obstructive sleep apnea (adult) (pediatric): Secondary | ICD-10-CM | POA: Insufficient documentation

## 2015-12-21 DIAGNOSIS — E669 Obesity, unspecified: Secondary | ICD-10-CM | POA: Insufficient documentation

## 2015-12-21 DIAGNOSIS — G4719 Other hypersomnia: Secondary | ICD-10-CM | POA: Diagnosis present

## 2016-01-18 ENCOUNTER — Ambulatory Visit
Admission: RE | Admit: 2016-01-18 | Discharge: 2016-01-18 | Disposition: A | Discharge status: Home / Self Care | Payer: Medicare (Managed Care) | Source: Ambulatory Visit | Attending: Family Medicine | Admitting: Family Medicine

## 2016-01-18 DIAGNOSIS — D1779 Benign lipomatous neoplasm of other sites: Secondary | ICD-10-CM | POA: Diagnosis not present

## 2016-01-18 DIAGNOSIS — R911 Solitary pulmonary nodule: Secondary | ICD-10-CM | POA: Insufficient documentation

## 2016-01-18 DIAGNOSIS — I517 Cardiomegaly: Secondary | ICD-10-CM | POA: Insufficient documentation

## 2016-01-18 DIAGNOSIS — I2510 Atherosclerotic heart disease of native coronary artery without angina pectoris: Secondary | ICD-10-CM | POA: Diagnosis not present

## 2016-01-18 LAB — POCT I-STAT CREATININE: Creatinine, Ser: 2 mg/dL — ABNORMAL HIGH (ref 0.44–1.00)

## 2016-02-09 ENCOUNTER — Other Ambulatory Visit: Payer: Self-pay | Admitting: Specialist

## 2016-02-09 DIAGNOSIS — R918 Other nonspecific abnormal finding of lung field: Secondary | ICD-10-CM

## 2016-04-21 ENCOUNTER — Other Ambulatory Visit: Payer: Self-pay | Admitting: Internal Medicine

## 2016-04-21 DIAGNOSIS — R911 Solitary pulmonary nodule: Secondary | ICD-10-CM

## 2016-04-27 ENCOUNTER — Ambulatory Visit
Admission: RE | Admit: 2016-04-27 | Discharge: 2016-04-27 | Disposition: A | Discharge status: Home / Self Care | Payer: Medicare (Managed Care) | Source: Ambulatory Visit | Attending: Internal Medicine | Admitting: Internal Medicine

## 2016-04-27 DIAGNOSIS — N63 Unspecified lump in unspecified breast: Secondary | ICD-10-CM | POA: Insufficient documentation

## 2016-04-27 DIAGNOSIS — D175 Benign lipomatous neoplasm of intra-abdominal organs: Secondary | ICD-10-CM | POA: Diagnosis not present

## 2016-04-27 DIAGNOSIS — R911 Solitary pulmonary nodule: Secondary | ICD-10-CM

## 2016-04-27 DIAGNOSIS — R918 Other nonspecific abnormal finding of lung field: Secondary | ICD-10-CM | POA: Diagnosis not present

## 2016-05-05 ENCOUNTER — Other Ambulatory Visit: Payer: Self-pay | Admitting: *Deleted

## 2016-05-05 ENCOUNTER — Inpatient Hospital Stay
Admission: RE | Admit: 2016-05-05 | Discharge: 2016-05-05 | Disposition: A | Discharge status: Home / Self Care | Payer: Self-pay | Source: Ambulatory Visit | Attending: *Deleted | Admitting: *Deleted

## 2016-05-05 DIAGNOSIS — Z9289 Personal history of other medical treatment: Secondary | ICD-10-CM

## 2016-09-17 ENCOUNTER — Encounter: Payer: Self-pay | Admitting: *Deleted

## 2016-09-17 ENCOUNTER — Emergency Department: Payer: Medicare (Managed Care)

## 2016-09-17 ENCOUNTER — Emergency Department
Admission: EM | Admit: 2016-09-17 | Discharge: 2016-09-18 | Disposition: A | Discharge status: Home / Self Care | Payer: Medicare (Managed Care) | Attending: Emergency Medicine | Admitting: Emergency Medicine

## 2016-09-17 DIAGNOSIS — N390 Urinary tract infection, site not specified: Secondary | ICD-10-CM | POA: Diagnosis not present

## 2016-09-17 DIAGNOSIS — R079 Chest pain, unspecified: Secondary | ICD-10-CM | POA: Diagnosis present

## 2016-09-17 DIAGNOSIS — I2510 Atherosclerotic heart disease of native coronary artery without angina pectoris: Secondary | ICD-10-CM | POA: Insufficient documentation

## 2016-09-17 DIAGNOSIS — Z7982 Long term (current) use of aspirin: Secondary | ICD-10-CM | POA: Insufficient documentation

## 2016-09-17 DIAGNOSIS — R739 Hyperglycemia, unspecified: Secondary | ICD-10-CM

## 2016-09-17 DIAGNOSIS — I509 Heart failure, unspecified: Secondary | ICD-10-CM | POA: Diagnosis not present

## 2016-09-17 DIAGNOSIS — I110 Hypertensive heart disease with heart failure: Secondary | ICD-10-CM | POA: Diagnosis not present

## 2016-09-17 DIAGNOSIS — E1165 Type 2 diabetes mellitus with hyperglycemia: Secondary | ICD-10-CM | POA: Diagnosis not present

## 2016-09-17 DIAGNOSIS — J449 Chronic obstructive pulmonary disease, unspecified: Secondary | ICD-10-CM | POA: Insufficient documentation

## 2016-09-17 DIAGNOSIS — F17210 Nicotine dependence, cigarettes, uncomplicated: Secondary | ICD-10-CM | POA: Insufficient documentation

## 2016-09-17 DIAGNOSIS — R0789 Other chest pain: Secondary | ICD-10-CM

## 2016-09-17 LAB — BASIC METABOLIC PANEL
ANION GAP: 9 (ref 5–15)
BUN: 31 mg/dL — ABNORMAL HIGH (ref 6–20)
CALCIUM: 8.5 mg/dL — AB (ref 8.9–10.3)
CHLORIDE: 100 mmol/L — AB (ref 101–111)
CO2: 25 mmol/L (ref 22–32)
Creatinine, Ser: 2.14 mg/dL — ABNORMAL HIGH (ref 0.44–1.00)
GFR calc non Af Amer: 23 mL/min — ABNORMAL LOW (ref >60)
GFR, EST AFRICAN AMERICAN: 26 mL/min — AB (ref >60)
GLUCOSE: 411 mg/dL — AB (ref 65–99)
POTASSIUM: 4.3 mmol/L (ref 3.5–5.1)
Sodium: 134 mmol/L — ABNORMAL LOW (ref 135–145)

## 2016-09-17 LAB — URINALYSIS, COMPLETE (UACMP) WITH MICROSCOPIC
Bilirubin Urine: NEGATIVE
Glucose, UA: >=500 mg/dL — AB
Hgb urine dipstick: NEGATIVE
Ketones, ur: NEGATIVE mg/dL
Nitrite: POSITIVE — AB
PROTEIN: 30 mg/dL — AB
SPECIFIC GRAVITY, URINE: 1.009 (ref 1.005–1.030)
pH: 5 (ref 5.0–8.0)

## 2016-09-17 LAB — CBC
HEMATOCRIT: 35.8 % (ref 35.0–47.0)
HEMOGLOBIN: 11.4 g/dL — AB (ref 12.0–16.0)
MCH: 27.8 pg (ref 26.0–34.0)
MCHC: 31.8 g/dL — AB (ref 32.0–36.0)
MCV: 87.6 fL (ref 80.0–100.0)
Platelets: 166 10*3/uL (ref 150–440)
RBC: 4.09 MIL/uL (ref 3.80–5.20)
RDW: 14.5 % (ref 11.5–14.5)
WBC: 8.6 10*3/uL (ref 3.6–11.0)

## 2016-09-17 LAB — TROPONIN I

## 2016-09-17 LAB — LACTIC ACID, PLASMA: LACTIC ACID, VENOUS: 1.5 mmol/L (ref 0.5–1.9)

## 2016-09-17 MED ORDER — SODIUM CHLORIDE 0.9 % IV BOLUS (SEPSIS)
1000.0000 mL | Freq: Once | INTRAVENOUS | Status: AC
  Administered 2016-09-17: 1000 mL via INTRAVENOUS

## 2016-09-17 MED ORDER — CEFTRIAXONE SODIUM IN DEXTROSE 20 MG/ML IV SOLN
1.0000 g | Freq: Once | INTRAVENOUS | Status: AC
  Administered 2016-09-17: 1 g via INTRAVENOUS
  Filled 2016-09-17: qty 50

## 2016-09-17 MED ORDER — CEFPODOXIME PROXETIL 100 MG PO TABS: 100.0000 mg | ORAL_TABLET | Freq: Two times a day (BID) | ORAL | 0 refills | Status: DC

## 2016-09-17 NOTE — ED Notes (Signed)
Patient transported to X-ray. Returned from [employer]

## 2016-09-17 NOTE — ED Provider Notes (Signed)
[facility] Emergency Department Provider Note  ____________________________________________  Time seen: Approximately 9:30 PM  I have reviewed the triage vital signs and the nursing notes.   HISTORY  Chief Complaint Chest Pain  Level 5 caveat:  Portions of the history and physical were unable to be obtained due to the patient's poor historian   HPI Barbara Farley is a 71 y.o. female who complains of chest pain that started about 4:00 today. She reports it is central chest, nonradiating, no associated shortness of breath diaphoresis or vomiting, not exertional, not pleuritic. Intermittent lasting a few seconds at a time, sharp.  Also notes her blood sugars been running 400-500 over the past several days despite complies with her medications.  Denies polyuria. Denies dysuria. Denies cough or any other pain complaint.   Past Medical History:  Diagnosis Date  . Anemia   . CAD (coronary artery disease)   . CHF (congestive heart failure) ([place])   . COPD (chronic obstructive pulmonary disease) ([place])   . Diabetes mellitus without complication ([place])   . Hyperlipemia   . Hypertension   . Morbid obesity ([place])   . Seizures ([place])   . Sleep apnea      Patient Active Problem List   Diagnosis Date Noted  . CHF (congestive heart failure) ([place]) 06/18/2015     Past Surgical History:  Procedure Laterality Date  . BELOW KNEE LEG AMPUTATION       Prior to Admission medications   Medication Sig Start Date End Date Taking? Authorizing Provider  amLODipine-atorvastatin (CADUET) 5-40 MG tablet Take 1 tablet by mouth daily.    [provider]  aspirin EC 81 MG tablet Take 81 mg by mouth daily.    [provider]  cefpodoxime (VANTIN) 100 MG tablet Take 1 tablet (100 mg total) by mouth 2 (two) times daily. 09/17/16   Carrie Mew, MD  docusate sodium (COLACE) 100 MG capsule Take 100 mg by mouth daily.    [provider]  furosemide  (LASIX) 20 MG tablet Take 60 mg by mouth every morning.    [provider]  isosorbide mononitrate (IMDUR) 30 MG 24 hr tablet Take 30 mg by mouth daily.    [provider]  oxybutynin (DITROPAN-XL) 10 MG 24 hr tablet Take 10 mg by mouth daily.    [provider]  PARoxetine (PAXIL) 20 MG tablet Take 20 mg by mouth daily.    [provider]  ranitidine (ZANTAC) 150 MG tablet Take 150 mg by mouth 2 (two) times daily.    [provider]  senna (SENOKOT) 8.6 MG TABS tablet Take 2 tablets by mouth at bedtime.    [provider]  Vitamin D, Ergocalciferol, (DRISDOL) 50000 units CAPS capsule Take 50,000 Units by mouth every 30 (thirty) days. Take on the first Monday of each month    [provider]     Allergies Patient has no known allergies.   Family History  Problem Relation Age of Onset  . Diabetes Unknown     Social History Social History  Substance Use Topics  . Smoking status: Current Every Day Smoker    Types: Cigarettes  . Smokeless tobacco: Never Used  . Alcohol use No    Review of Systems  Constitutional:   No fever or chills.  ENT:   No sore throat. No rhinorrhea. Lymphatic: No swollen glands, No extremity swelling Endocrine: No hot/cold flashes. No significant weight change. No neck swelling. Cardiovascular:  Positive as above chest pain. Respiratory:   No dyspnea or cough. Gastrointestinal:   Negative for abdominal pain, vomiting and diarrhea.  Genitourinary:   Negative for dysuria or difficulty urinating. Musculoskeletal:   Negative for focal pain or swelling Neurological:   Negative for headaches or weakness. All other systems reviewed and are negative except as documented above in ROS and HPI.  ____________________________________________   PHYSICAL EXAM:  VITAL SIGNS: ED Triage Vitals  Enc Vitals Group     BP 09/17/16 2039 (!) 96/45     Pulse Rate 09/17/16 2039 89     Resp 09/17/16 2039 19      Temp --      Temp src --      SpO2 09/17/16 2025 95 %     Weight 09/17/16 2040 (!) 315 lb 9.6 oz (143.2 kg)     Height 09/17/16 2040 5\' 6"  (1.676 m)     Head Circumference --      Peak Flow --      Pain Score 09/17/16 2039 0     Pain Loc --      Pain Edu? --      Excl. in [place]? --     Vital signs reviewed, nursing assessments reviewed.   Constitutional:   Alert and oriented. Well appearing and in no distress.Morbidly obese Eyes:   No scleral icterus. No conjunctival pallor. PERRL. EOMI.  No nystagmus. ENT   Head:   Normocephalic and atraumatic.   Nose:   No congestion/rhinnorhea. No septal hematoma   Mouth/Throat:   MMM, no pharyngeal erythema. No peritonsillar mass.    Neck:   No stridor. No SubQ emphysema. No meningismus. Hematological/Lymphatic/Immunilogical:   No cervical lymphadenopathy. Cardiovascular:   RRR. Symmetric bilateral radial and DP pulses.  No murmurs.  Respiratory:   Normal respiratory effort without tachypnea nor retractions. Breath sounds are clear and equal bilaterally. No wheezes/rales/rhonchi. Gastrointestinal:   Soft and nontender. Non distended. There is no CVA tenderness.  No rebound, rigidity, or guarding. Genitourinary:   deferred Musculoskeletal:   Normal range of motion in all extremities. No joint effusions.  No lower extremity tenderness.  No edema. Neurologic:   Normal speech and language.  CN 2-10 normal. Motor grossly intact. No gross focal neurologic deficits are appreciated.  Skin:    Skin is warm, dry and intact. No rash noted.  No petechiae, purpura, or bullae.  ____________________________________________    LABS (pertinent positives/negatives) (all labs ordered are listed, but only abnormal results are displayed) Labs Reviewed  BASIC METABOLIC PANEL - Abnormal; Notable for the following:       Result Value   Sodium 134 (*)    Chloride 100 (*)    Glucose, Bld 411 (*)    BUN 31 (*)    Creatinine, Ser 2.14 (*)     Calcium 8.5 (*)    GFR calc non Af Amer 23 (*)    GFR calc Af Amer 26 (*)    All other components within normal limits  CBC - Abnormal; Notable for the following:    Hemoglobin 11.4 (*)    MCHC 31.8 (*)    All other components within normal limits  URINALYSIS, COMPLETE (UACMP) WITH MICROSCOPIC - Abnormal; Notable for the following:    Color, Urine YELLOW (*)    APPearance HAZY (*)    Glucose, UA >=500 (*)    Protein, ur 30 (*)    Nitrite POSITIVE (*)    Leukocytes, UA SMALL (*)  Bacteria, UA MANY (*)    Squamous Epithelial / LPF 0-5 (*)    All other components within normal limits  URINE CULTURE  TROPONIN I  LACTIC ACID, PLASMA  CBG MONITORING, ED   ____________________________________________   EKG  EKG performed by EMS, interpreted by me Sinus tachycardia rate 101, left axis, normal intervals. Poor R-wave progression in anterior precordial leads. Normal ST segments and T waves. One premature supraventricular beat.  ____________________________________________    RADIOLOGY  Dg Chest 2 View  Result Date: 09/17/2016 CLINICAL DATA:  Recurrent chest pain.  History of stroke. EXAM: CHEST  2 VIEW COMPARISON:  06/18/2015 CXR, 04/27/2016 chest CT FINDINGS: Low lung volumes with stable cardiomegaly. No aortic aneurysm. Mild interstitial prominence may reflect interstitial edema. Superimposed ill-defined bilateral upper lobe previously documented masslike opacities are again noted without significant change radiographically. No effusion or pneumothorax. Osteoarthritis of the glenohumeral joints bilaterally. IMPRESSION: Previously documented ill-defined masslike opacities in the upper lobes bilaterally are again noted without significant change radiographically, superimposed on mild diffuse interstitial prominence, possibly representing interstitial edema. Electronically Signed   By: Ashley Royalty M.D.   On: 09/17/2016 22:17     ____________________________________________   PROCEDURES Procedures  ____________________________________________   INITIAL IMPRESSION / ASSESSMENT AND PLAN / ED COURSE  Pertinent labs & imaging results that were available during my care of the patient were reviewed by me and considered in my medical decision making (see chart for details).  Patient well appearing no acute distress, presents with atypical noncardiac chest pain.Considering the patient's symptoms, medical history, and physical examination today, I have low suspicion for ACS, PE, TAD, pneumothorax, carditis, mediastinitis, pneumonia, CHF, or sepsis.  More concerning is her hyperglycemia raising suspicion of DKA versus dehydration. We'll give IV fluids while checking labs.     Clinical Course as of Sep 17 2308  Sun Sep 17, 2016  2237 Clear UTI. Will tx with rocephin in ED, vantin at home. F/u PCP. U. Cx. Sent. Pt is not septic. Nitrite: (!) POSITIVE [PS]  2237 baseline Creatinine: (!) 2.14 [PS]    Clinical Course User Index [PS] Carrie Mew, MD     ____________________________________________   FINAL CLINICAL IMPRESSION(S) / ED DIAGNOSES  Final diagnoses:  Acute lower UTI (urinary tract infection)  Hyperglycemia  Atypical chest pain      New Prescriptions   CEFPODOXIME (VANTIN) 100 MG TABLET    Take 1 tablet (100 mg total) by mouth 2 (two) times daily.     Portions of this note were generated with dragon dictation software. Dictation errors may occur despite best attempts at proofreading.    Carrie Mew, MD 09/17/16 [phone]

## 2016-09-17 NOTE — ED Triage Notes (Signed)
Pt states CP started @ 1600 today. Pt states she has recurrent ches [pain q 3 months approximately. Pt in no present distress at this time. Pt has hx of stroke. Pt is somewhat poor historian, unable to disclose home meds or PMH. Pt states when she had chest pain, no radiation and it was resolved w/ ASA 324 mg administed by EMS. Pt is part of [facility]. Pt placed on 2L/ O2/ [place] which she disclosed she wears at home at night.

## 2016-09-18 LAB — GLUCOSE, CAPILLARY: GLUCOSE-CAPILLARY: 285 mg/dL — AB (ref 65–99)

## 2016-09-20 LAB — URINE CULTURE: Culture: >=100000 — AB

## 2016-12-13 ENCOUNTER — Other Ambulatory Visit: Payer: Self-pay | Admitting: Internal Medicine

## 2016-12-13 DIAGNOSIS — D241 Benign neoplasm of right breast: Secondary | ICD-10-CM

## 2016-12-19 ENCOUNTER — Other Ambulatory Visit: Payer: Self-pay | Admitting: Internal Medicine

## 2016-12-19 DIAGNOSIS — D241 Benign neoplasm of right breast: Secondary | ICD-10-CM

## 2017-01-30 ENCOUNTER — Ambulatory Visit
Admission: RE | Admit: 2017-01-30 | Discharge: 2017-01-30 | Disposition: A | Discharge status: Home / Self Care | Payer: Medicare (Managed Care) | Source: Ambulatory Visit | Attending: Internal Medicine | Admitting: Internal Medicine

## 2017-01-30 DIAGNOSIS — N6321 Unspecified lump in the left breast, upper outer quadrant: Secondary | ICD-10-CM | POA: Diagnosis not present

## 2017-01-30 DIAGNOSIS — D241 Benign neoplasm of right breast: Secondary | ICD-10-CM | POA: Diagnosis present

## 2017-01-30 DIAGNOSIS — N6314 Unspecified lump in the right breast, lower inner quadrant: Secondary | ICD-10-CM | POA: Diagnosis not present

## 2017-01-30 DIAGNOSIS — R928 Other abnormal and inconclusive findings on diagnostic imaging of breast: Secondary | ICD-10-CM | POA: Insufficient documentation

## 2017-01-30 DIAGNOSIS — N6311 Unspecified lump in the right breast, upper outer quadrant: Secondary | ICD-10-CM | POA: Insufficient documentation

## 2017-02-12 ENCOUNTER — Other Ambulatory Visit: Payer: Self-pay | Admitting: Internal Medicine

## 2017-02-12 DIAGNOSIS — R928 Other abnormal and inconclusive findings on diagnostic imaging of breast: Secondary | ICD-10-CM

## 2017-02-12 DIAGNOSIS — N6310 Unspecified lump in the right breast, unspecified quadrant: Secondary | ICD-10-CM

## 2017-02-28 ENCOUNTER — Ambulatory Visit
Admission: RE | Admit: 2017-02-28 | Discharge: 2017-02-28 | Disposition: A | Discharge status: Home / Self Care | Payer: Medicare (Managed Care) | Source: Ambulatory Visit | Attending: Internal Medicine | Admitting: Internal Medicine

## 2017-02-28 DIAGNOSIS — N6314 Unspecified lump in the right breast, lower inner quadrant: Secondary | ICD-10-CM | POA: Insufficient documentation

## 2017-02-28 DIAGNOSIS — R928 Other abnormal and inconclusive findings on diagnostic imaging of breast: Secondary | ICD-10-CM

## 2017-02-28 DIAGNOSIS — N6310 Unspecified lump in the right breast, unspecified quadrant: Secondary | ICD-10-CM

## 2017-02-28 HISTORY — PX: BREAST BIOPSY: SHX20

## 2017-03-01 LAB — SURGICAL PATHOLOGY

## 2017-03-12 ENCOUNTER — Other Ambulatory Visit: Payer: Self-pay | Admitting: Internal Medicine

## 2017-03-12 DIAGNOSIS — N6310 Unspecified lump in the right breast, unspecified quadrant: Secondary | ICD-10-CM

## 2017-03-12 DIAGNOSIS — R928 Other abnormal and inconclusive findings on diagnostic imaging of breast: Secondary | ICD-10-CM

## 2017-03-20 ENCOUNTER — Ambulatory Visit
Admission: RE | Admit: 2017-03-20 | Discharge: 2017-03-20 | Disposition: A | Discharge status: Home / Self Care | Payer: Medicare (Managed Care) | Source: Ambulatory Visit | Attending: Internal Medicine | Admitting: Internal Medicine

## 2017-03-20 DIAGNOSIS — N6311 Unspecified lump in the right breast, upper outer quadrant: Secondary | ICD-10-CM | POA: Diagnosis not present

## 2017-03-20 DIAGNOSIS — N6312 Unspecified lump in the right breast, upper inner quadrant: Secondary | ICD-10-CM | POA: Insufficient documentation

## 2017-03-20 DIAGNOSIS — R928 Other abnormal and inconclusive findings on diagnostic imaging of breast: Secondary | ICD-10-CM

## 2017-03-20 DIAGNOSIS — N6314 Unspecified lump in the right breast, lower inner quadrant: Secondary | ICD-10-CM | POA: Insufficient documentation

## 2017-03-20 DIAGNOSIS — N6310 Unspecified lump in the right breast, unspecified quadrant: Secondary | ICD-10-CM

## 2017-03-20 HISTORY — PX: BREAST BIOPSY: SHX20

## 2017-03-21 LAB — SURGICAL PATHOLOGY

## 2017-03-28 ENCOUNTER — Other Ambulatory Visit (HOSPITAL_COMMUNITY): Payer: Self-pay | Admitting: Family Medicine

## 2017-03-28 DIAGNOSIS — N6311 Unspecified lump in the right breast, upper outer quadrant: Secondary | ICD-10-CM

## 2017-04-09 ENCOUNTER — Encounter: Payer: Self-pay | Admitting: *Deleted

## 2017-04-09 ENCOUNTER — Encounter (HOSPITAL_COMMUNITY): Payer: Self-pay

## 2017-04-09 ENCOUNTER — Ambulatory Visit (HOSPITAL_COMMUNITY)
Admission: RE | Admit: 2017-04-09 | Discharge: 2017-04-09 | Disposition: A | Discharge status: Home / Self Care | Payer: Medicare (Managed Care) | Source: Ambulatory Visit | Attending: Family Medicine | Admitting: Family Medicine

## 2017-04-11 ENCOUNTER — Ambulatory Visit (INDEPENDENT_AMBULATORY_CARE_PROVIDER_SITE_OTHER): Payer: Medicare (Managed Care) | Admitting: General Surgery

## 2017-04-11 ENCOUNTER — Encounter: Payer: Self-pay | Admitting: General Surgery

## 2017-04-11 VITALS — BP 134/74 | HR 102 | Resp 16 | Ht 66.0 in | Wt 284.0 lb

## 2017-04-11 DIAGNOSIS — N6311 Unspecified lump in the right breast, upper outer quadrant: Secondary | ICD-10-CM | POA: Diagnosis not present

## 2017-04-11 DIAGNOSIS — R928 Other abnormal and inconclusive findings on diagnostic imaging of breast: Secondary | ICD-10-CM | POA: Diagnosis not present

## 2017-04-11 NOTE — Patient Instructions (Addendum)
MRI breast has been scheduled at the [facility] for tomorrow, 04-12-17. The address is [address] [place]. Please arrive by 10:45 am to check in. Take your insurance card and photo ID. Please do not have any caffeine 4 hours prior and no solid foods 4 hours prior. Also, please do not use any body powder, lotion, or deodorant.

## 2017-04-11 NOTE — Progress Notes (Signed)
Patient ID: Barbara Farley, female   DOB: 01-22-1946, 71 y.o.   MRN: [id number]  Chief Complaint  Patient presents with  . Breast Problem    HPI Barbara Farley is a 71 y.o. female.  who presents for a breast evaluation referred by Dr Wilford Corner. The most recent mammogram was done on 01-30-17 . Right breast biopsy was 02-28-17 showing fibroepithelial lesion second lesion was fibroadenomatoid change with columnar cell changes. She states she has felt a lump right breast and her axilla for several years. Patient does perform regular self breast checks and does not get regular mammograms done.   She is a resident of [facility].  HPI  Past Medical History:  Diagnosis Date  . Anemia   . CAD (coronary artery disease)   . CHF (congestive heart failure) ([place])   . COPD (chronic obstructive pulmonary disease) ([place])   . Diabetes mellitus without complication ([place])   . Hx of BKA, left ([place]) 2008?  Marland Kitchen Hyperlipemia   . Hypertension   . Morbid obesity ([place])   . Seizures ([place])   . Sleep apnea     Past Surgical History:  Procedure Laterality Date  . BELOW KNEE LEG AMPUTATION Left 2008  . BREAST BIOPSY Right 03/20/2017   u/s core- 2 areas path pending  . BREAST BIOPSY Right 02/2017    Family History  Problem Relation Age of Onset  . Diabetes Unknown   . Breast cancer Mother        in early 59's    Social History Social History   Tobacco Use  . Smoking status: Current Every Day Smoker    Types: Cigarettes  . Smokeless tobacco: Never Used  Substance Use Topics  . Alcohol use: No    Alcohol/week: 0.0 oz  . Drug use: No    No Known Allergies  Current Outpatient Medications  Medication Sig Dispense Refill  . amLODipine-atorvastatin (CADUET) 5-40 MG tablet Take 1 tablet by mouth daily.    Marland Kitchen aspirin EC 81 MG tablet Take 81 mg by mouth daily.    Marland Kitchen docusate sodium (COLACE) 100 MG capsule Take 100 mg by mouth daily.    . furosemide (LASIX) 20 MG tablet Take 60 mg by  mouth every morning.    . isosorbide mononitrate (IMDUR) 30 MG 24 hr tablet Take 30 mg by mouth daily.    Marland Kitchen lisinopril (PRINIVIL,ZESTRIL) 40 MG tablet Take 40 mg by mouth daily.     Marland Kitchen oxybutynin (DITROPAN-XL) 10 MG 24 hr tablet Take 10 mg by mouth daily.    Marland Kitchen PARoxetine (PAXIL) 20 MG tablet Take 20 mg by mouth daily.    . ranitidine (ZANTAC) 150 MG tablet Take 150 mg by mouth 2 (two) times daily.    Marland Kitchen senna (SENOKOT) 8.6 MG TABS tablet Take 2 tablets by mouth at bedtime.    . Vitamin D, Ergocalciferol, (DRISDOL) 50000 units CAPS capsule Take 50,000 Units by mouth every 30 (thirty) days. Take on the first Monday of each month     No current facility-administered medications for this visit.     Review of Systems Review of Systems  Constitutional: Negative.   HENT: Positive for facial swelling (Left side, noted recently).   Respiratory: Negative.   Cardiovascular: Negative.   Gastrointestinal: Negative.   Genitourinary: Negative.     Blood pressure 134/74, pulse (!) 102, resp. rate 16, height 5\' 6"  (1.676 m), weight 284 lb (128.8 kg).  Physical Exam Physical Exam  Constitutional: She  is oriented to person, place, and time. She appears well-developed and well-nourished.  Patient obviously has limited mobility because of her size and the fact that she is a left BKA.  She is using a wheelchair at present but states that she is able to walk some with a walker  HENT:  Mouth/Throat: Oropharynx is clear and moist.  Eyes: Conjunctivae are normal. No scleral icterus.  Neck: Neck supple.    Cardiovascular: Normal rate, regular rhythm and normal heart sounds.  Pulmonary/Chest: Effort normal and breath sounds normal. No respiratory distress. Right breast exhibits mass. Right breast exhibits no inverted nipple, no nipple discharge, no skin change and no tenderness. Left breast exhibits no inverted nipple, no mass, no nipple discharge, no skin change and no tenderness.    Ill defined 1.5 cm mass  at 3 o'clock right breast areolar margin  Lymphadenopathy:    She has no cervical adenopathy.    She has no axillary adenopathy.  Neurological: She is alert and oriented to person, place, and time.  Skin: Skin is warm and dry.  Psychiatric: Her behavior is normal.    Data Reviewed  Mammograms, ultrasounds and pathology  Assessment    Right breast mass-3:00 location near the areola biopsy showing a fibroepithelial lesion Right breast mass at 930 o'clock away from the central area biopsy showing fibroadenomatoid changes with columnar cell changes. Ultrasound also reveals multiple other nodules in the breast Left submandibular nodule      Plan    MRI breast to assess only nodule seen in the breast. If no other suspicious masses noted the 2 lesions that were previously biopsied can be removed with localization.  Patient advised accordingly [facility]     HPI, Physical Exam, Assessment and Plan have been scribed under the direction and in the presence of Mckinley Jewel, MD Karie Fetch, RN I have completed the exam and reviewed the above documentation for accuracy and completeness.  I agree with the above.  Haematologist has been used and any errors in dictation or transcription are unintentional.  Kathee Tumlin G. Jamal Collin, M.D., F.A.C.S.   Junie Panning G 04/11/2017, 2:30 PM   Patient has been scheduled for a bilateral breast MRI at the [facility] for 04-12-17 at 11:10 am (arrive 10:45 am).  An appointment will also be arranged for the patient to see [facility] regarding a left submandibular mass.  Dominga Ferry, CMA

## 2017-04-12 ENCOUNTER — Ambulatory Visit
Admission: RE | Admit: 2017-04-12 | Discharge: 2017-04-12 | Disposition: A | Discharge status: Home / Self Care | Payer: Medicare (Managed Care) | Source: Ambulatory Visit | Attending: General Surgery | Admitting: General Surgery

## 2017-04-12 DIAGNOSIS — R928 Other abnormal and inconclusive findings on diagnostic imaging of breast: Secondary | ICD-10-CM

## 2017-04-12 DIAGNOSIS — N6311 Unspecified lump in the right breast, upper outer quadrant: Secondary | ICD-10-CM

## 2017-04-23 ENCOUNTER — Other Ambulatory Visit: Payer: Self-pay | Admitting: *Deleted

## 2017-04-23 DIAGNOSIS — R220 Localized swelling, mass and lump, head: Secondary | ICD-10-CM

## 2017-05-03 ENCOUNTER — Ambulatory Visit: Admission: RE | Admit: 2017-05-03 | Payer: Medicare (Managed Care) | Source: Ambulatory Visit

## 2017-05-03 ENCOUNTER — Telehealth: Payer: Self-pay | Admitting: *Deleted

## 2017-05-03 NOTE — Telephone Encounter (Signed)
Jenny Reichmann from [facility] (Phone: [phone]) called the office today to report that they had attempted to complete patient's bilateral breast MRI on two different occasions with no success. One attempt was on 04-12-17 and the other today, 05-03-17.   Per Jenny Reichmann, they have not been able to find a vein to hold a small angio cath. They attempted with 6 sticks to the patient today.   Jenny Reichmann wanted to see what the next step would be since they have been unsuccessful.   Dr. Jamal Collin was notified and states that they will need to discuss with the radiologist (in mammography) who read off on the patient's recent images to see what they would recommend from here.   Cindy at [facility] has been notified of the above.

## 2017-05-03 NOTE — Telephone Encounter (Signed)
Jenny Reichmann with [facility] (Phone: [phone]) called the office back to report that she did speak with the radiologist on call today-Dr. Joneen Caraway.   She states that he recommended two options.   One option being if Dr. Jamal Collin could arrange for patient to go to the IV team at [facility] and have an IV hep lock to be secured as an outpatient and then have the patient transported to their facility or two, have the interventional radiology folks at [facility] put in a power PICC line.   Dr. Jamal Collin to be notified and await decision.

## 2017-05-07 NOTE — Telephone Encounter (Signed)
Per Dr. Jamal Collin, make a follow up appointment with Dr. Bary Castilla.   Dr. Jamal Collin does not feel she needs go through all of what the radiologist recommended but will leave this up to Dr. Bary Castilla to decide.

## 2017-05-07 NOTE — Telephone Encounter (Signed)
Ebony with [facility] was contacted today and notified that patient would need to see Dr. Bary Castilla for follow up. She states that she will need to check with patient's PCP and see if she feels this is necessary. Charlena Cross states she will call our office back if appointment is needed.

## 2017-05-16 ENCOUNTER — Telehealth: Payer: Self-pay | Admitting: *Deleted

## 2017-05-16 NOTE — Telephone Encounter (Signed)
She is unable to lay on her abdomen for breast MRI. Appointment to see Dr Bary Castilla is 05-17-17. She has not had the ENT appt as of yet.

## 2017-05-16 NOTE — Telephone Encounter (Signed)
-----   Message from Robert Bellow, MD sent at 05/15/2017  8:36 AM EST ----- Patient of sank's I am picking up.  Contact her to see if she is able to lay on her stomach for up to one hour (which would be required if she is to have an MRI of the breasts.  If she can, we will work on having an IV started either at [facility] or [facility] prior to a study at [facility] on [address]. If she feels she is unable to lay on her stomach, then arrange a f/u appointment to discuss options.  Sank was setting up an appointment with [facility]. See if this has occurred. Thanks.

## 2017-05-17 ENCOUNTER — Ambulatory Visit (INDEPENDENT_AMBULATORY_CARE_PROVIDER_SITE_OTHER): Payer: Medicare (Managed Care) | Admitting: General Surgery

## 2017-05-17 ENCOUNTER — Encounter: Payer: Self-pay | Admitting: General Surgery

## 2017-05-17 VITALS — BP 140/66 | HR 72 | Resp 18 | Ht 66.0 in | Wt 271.0 lb

## 2017-05-17 DIAGNOSIS — N6311 Unspecified lump in the right breast, upper outer quadrant: Secondary | ICD-10-CM | POA: Diagnosis not present

## 2017-05-17 NOTE — Progress Notes (Signed)
Patient ID: Barbara Farley, female   DOB: Jan 22, 1946, 72 y.o.   MRN: [id number]  Chief Complaint  Patient presents with  . Follow-up    breast mass    HPI Barbara Farley is a 72 y.o. female here for a follow up assessment of a right breast mass. She was scheduled to have a breast MRI but was unable to complete this as they were unable to start an IV on her. She reports no new breast changes, the right breast still has some heavy feeling to it, but no change in size.                     HPI  Past Medical History:  Diagnosis Date  . Anemia   . CAD (coronary artery disease)   . CHF (congestive heart failure) ([place])   . COPD (chronic obstructive pulmonary disease) ([place])   . Diabetes mellitus without complication ([place])   . Hx of BKA, left ([place]) 2008?  Marland Kitchen Hyperlipemia   . Hypertension   . Morbid obesity ([place])   . Seizures ([place])   . Sleep apnea     Past Surgical History:  Procedure Laterality Date  . BELOW KNEE LEG AMPUTATION Left 2008  . BREAST BIOPSY Right 03/20/2017   u/s core- 2 areas path pending  . BREAST BIOPSY Right 02/2017    Family History  Problem Relation Age of Onset  . Diabetes Unknown   . Breast cancer Mother        in early 51's    Social History Social History   Tobacco Use  . Smoking status: Current Every Day Smoker    Types: Cigarettes  . Smokeless tobacco: Never Used  Substance Use Topics  . Alcohol use: No    Alcohol/week: 0.0 oz  . Drug use: No    No Known Allergies  Current Outpatient Medications  Medication Sig Dispense Refill  . amLODipine-atorvastatin (CADUET) 5-40 MG tablet Take 1 tablet by mouth daily.    Marland Kitchen aspirin EC 81 MG tablet Take 81 mg by mouth daily.    Marland Kitchen docusate sodium (COLACE) 100 MG capsule Take 100 mg by mouth daily.    . furosemide (LASIX) 20 MG tablet Take 60 mg by mouth every morning.    . isosorbide mononitrate (IMDUR) 30 MG 24 hr tablet Take 30 mg by mouth daily.    Marland Kitchen lisinopril (PRINIVIL,ZESTRIL) 40 MG tablet Take 40  mg by mouth daily.     Marland Kitchen oxybutynin (DITROPAN-XL) 10 MG 24 hr tablet Take 10 mg by mouth daily.    Marland Kitchen PARoxetine (PAXIL) 20 MG tablet Take 20 mg by mouth daily.    . ranitidine (ZANTAC) 150 MG tablet Take 150 mg by mouth 2 (two) times daily.    Marland Kitchen senna (SENOKOT) 8.6 MG TABS tablet Take 2 tablets by mouth at bedtime.    . Vitamin D, Ergocalciferol, (DRISDOL) 50000 units CAPS capsule Take 50,000 Units by mouth every 30 (thirty) days. Take on the first Monday of each month     No current facility-administered medications for this visit.     Review of Systems Review of Systems  Constitutional: Negative.   Respiratory: Negative.   Cardiovascular: Negative.     Blood pressure 140/66, pulse 72, resp. rate 18, height 5\' 6"  (1.676 m), weight 271 lb (122.9 kg).  Physical Exam Physical Exam  Constitutional: She is oriented to person, place, and time. She appears well-developed and well-nourished.  Eyes: Conjunctivae are normal. No  scleral icterus.  Neck: Neck supple.  Cardiovascular: Normal rate, regular rhythm and normal heart sounds.  Pulmonary/Chest: Effort normal and breath sounds normal. Right breast exhibits no inverted nipple, no mass, no nipple discharge, no skin change and no tenderness. Left breast exhibits no inverted nipple, no mass, no nipple discharge, no skin change and no tenderness.    Musculoskeletal:       Arms: Lymphadenopathy:    She has no cervical adenopathy.    She has no axillary adenopathy.  Neurological: She is alert and oriented to person, place, and time.  Skin: Skin is warm and dry.  There is a 3 cm, and 6 cm area of fatty tissue in the upper outer right arm   Psychiatric: She has a normal mood and affect.    Data Reviewed February 05, 1990 [facility] right breast biopsy: CLINICAL INFORMATION The patient is a 72 year old black female who undergoes right breast biopsy. Submitted tissue is right breast mass. A frozen section was requested by Dr. Tanya Nones in Room  18.   FROZEN SECTIONS FS1, RIGHT BREAST, BIOPSY - FIBROADENOMA WITH INCREASED STROMAL CELLULARITY, NOT SUSPICIOUS FOR PHYLLODES TUMOR; DEFER TO PERMANENTS The frozen section diagnosis is confirmed. There is ductular proliferation without epithelial atypia.The intervening stroma is loose and shows a moderately increased cellularity.There are infiltrates of chronic inflammatory cells with numerous plasma cells. There is no evidence of malignancy.   DIAGNOSIS RIGHT BREAST, RIGHT BREAST BIOPSY -FIBROADENOMA -CHRONIC INFLAMMATION  February 28, 2017 right breast biopsy: A. BREAST, RIGHT, LOWER INNER QUADRANT, 3:30 O'CLOCK 2 CM FROM NIPPLE,  AT ANTERIOR DEPTH; ULTRASOUND-GUIDED CORE BIOPSY:  - FIBROEPITHELIAL LESION, SEE COMMENT.   Comment:  The fibroepithelial lesion shows predominance of myxoid stroma and  partial intracanalicular architecture. Stromal cellularity is not  significantly increased in this sample, and there is no cytologic  atypia. Mitotic figures are rare. There are no epithelial proliferative  changes.   The histologic differential includes both fibroadenoma and phyllodes  tumor, and excision should be considered.   March 20, 2017 biopsy: A. BREAST, RIGHT 9:30; BIOPSY:  - FIBROADENOMATOID CHANGE.  - COLUMNAR CELL CHANGE AND USUAL HYPERPLASIA.  - NEGATIVE FOR ATYPIA AND MALIGNANCY.  B. BREAST, RIGHT 3:00; BIOPSY:  - FIBROEPITHELIAL LESION.   Comment:  The fibroepithelial lesion shows areas of usual epithelial hyperplasia.  Stromal cellularity is not significantly increased in this sample, and  there is no cytologic atypia. Mitotic figures are rare. The histologic  differential includes both fibroadenoma and phyllodes tumor, and  excision should be considered.   PET/CT of August 03, 2015 completed for pulmonary nodules:   Partially degraded exam due to large patient habitus. 2 adjacent right upper lobe pulmonary nodules show slight decrease in  size, and peripheral left upper lobe nodule remains stable. None of these nodules show definite metabolic activity, however low-grade carcinomas cannot definitely be excluded. Recommend continued followup by chest CT in 4-6 months.  No evidence of hypermetabolic lymphadenopathy or other signs of metastatic disease.  Stable benign left adrenal myelolipoma.  Chest CT of April 27, 2016:   1. Interval increase in size of a dominant right upper lobe pulmonary nodule, now measuring 2 cm in maximum diameter, suggestion of fine spiculation at the margin. The lesion was noted to be and suspicious on prior PET-CT, however given interval growth and slight change in morphology, suggest tissue sampling for further evaluation. Additional pulmonary nodules are otherwise unchanged. 2. Multiple bilateral breast nodules. Recommend mammographic and sonographic correlation. 3. 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   January 30, 2017 bilateral diagnostic mammograms were completed and compared to the most recent prior study of 2012. IMPRESSION: 1. Multiple bilateral circumscribed breast masses. These are most likely all benign masses. However, several on the right are new since the prior mammograms, which are over 53 years old. The largest of these lies in the anterior 3 o'clock position of the right breast, measuring 2.1 cm in greatest dimension. 2. There are no new or suspicious masses in the left breast.   Assessment    History of multiple right breast biopsies with changes suggestive of fibroadenoma/fungoides tumor.  Original biopsy of 1991 with similar pattern.  Multiplicity of lesions speaks against a malignant process.  Patient body habitus precludes consideration of MRI (she is unable to lie supine, let alone do so for an hour.  Enlargement of right upper lobe nodule on December 2017 chest CT.  No interval follow-up identified in electronic record.    Plan      Follow up in 3 months with a [facility] right diagnostic mammogram and office visit.    Will clarify with the patient whether she has had any follow-up in regards to the chest CT of 2017.   HPI, Physical Exam, Assessment and Plan have been scribed under the direction and in the presence of Barbara Bellow, MD  Barbara Living, Barbara Farley  I have completed the exam and reviewed the above documentation for accuracy and completeness.  I agree with the above.  Haematologist has been used and any errors in dictation or transcription are unintentional.  Hervey Ard, M.D., F.A.C.S.  Forest Gleason Beverlyann Broxterman 05/18/2017, 8:48 AM

## 2017-05-17 NOTE — Patient Instructions (Signed)
Follow up in April with right breast diagnostic mammogram and office visit here.   Continue self breast exams. Call office for any new breast issues or concerns.

## 2017-05-18 DIAGNOSIS — N6311 Unspecified lump in the right breast, upper outer quadrant: Secondary | ICD-10-CM | POA: Insufficient documentation

## 2017-05-26 IMAGING — CT CT RENAL STONE PROTOCOL
1 of 2 series · 15 of 32 positions shown, 19 images · non-contrast
Comparison: None.

CLINICAL DATA: Bilateral flank pain

EXAM:
CT ABDOMEN AND PELVIS WITHOUT CONTRAST
TECHNIQUE: Multidetector CT imaging of the abdomen and pelvis was performed
following the standard protocol without IV contrast.

[Series 2: stone standard full · axial · 0.89mm/px · z∈[-1213,-753]mm · 15 of 102 slices shown, 19 images]
[im 5/102  soft-tissue]
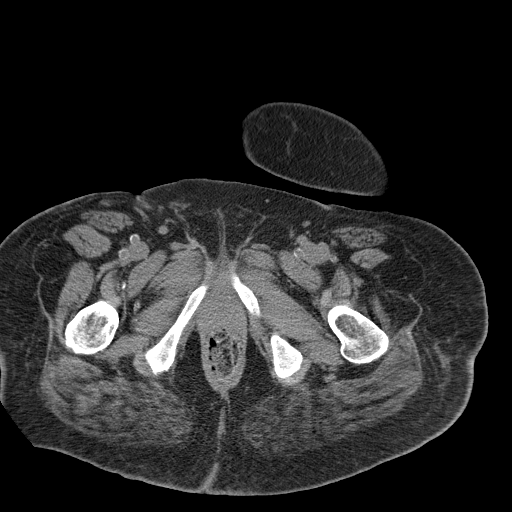
[im 5/102  bone]
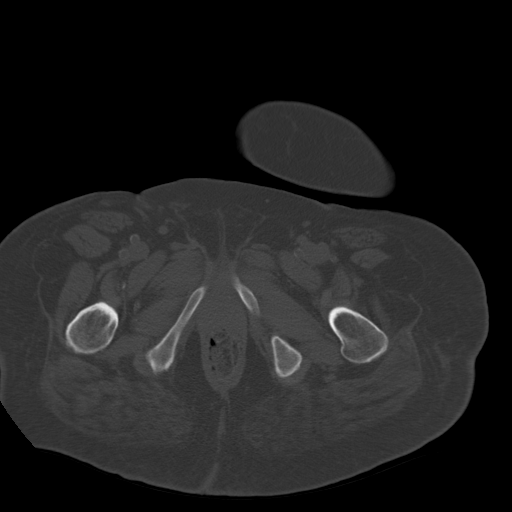
[im 13/102  soft-tissue]
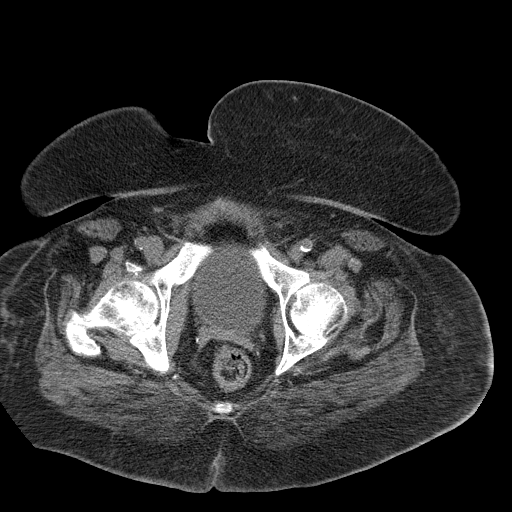
[im 22/102  soft-tissue]
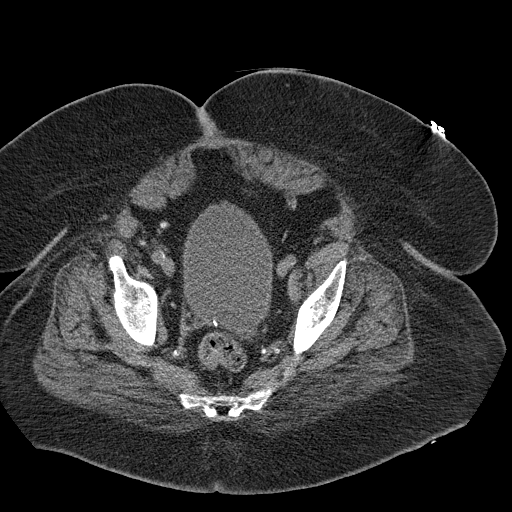
[im 30/102  soft-tissue]
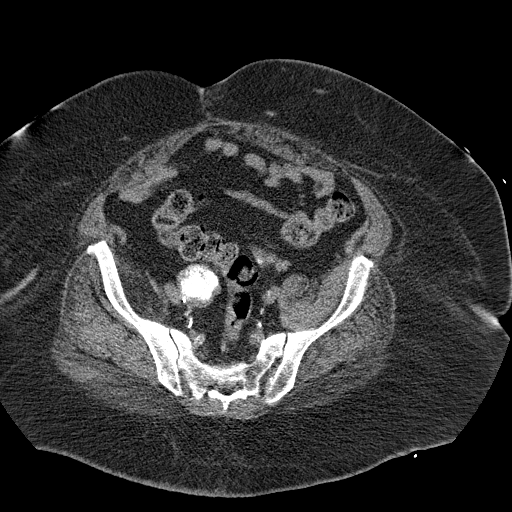
[im 34/102  soft-tissue]
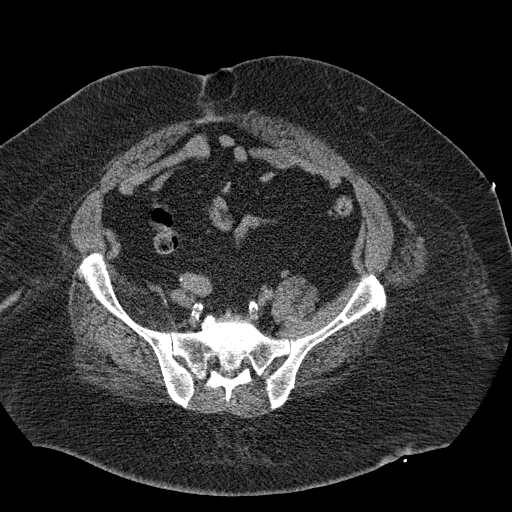
[im 43/102  soft-tissue]
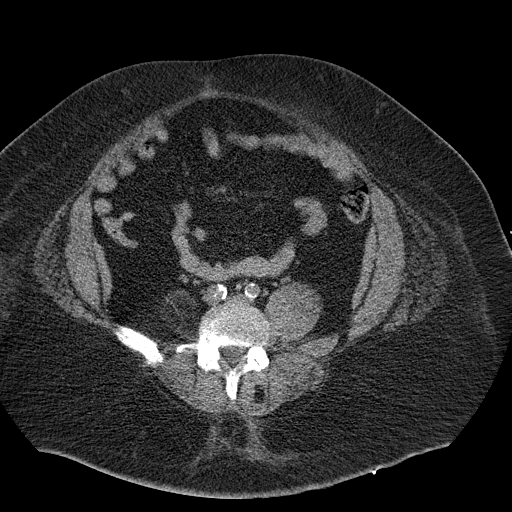
[im 51/102  soft-tissue]
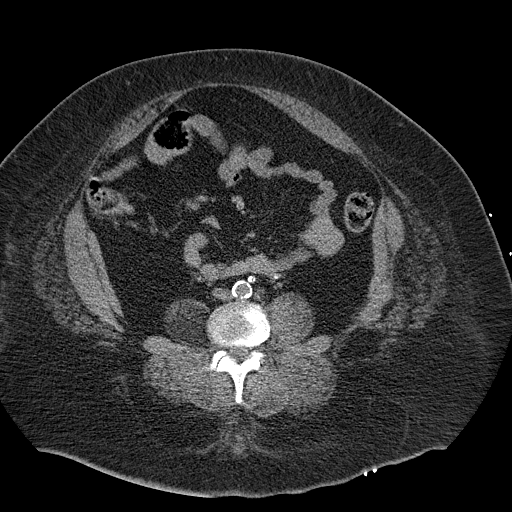
[im 59/102  soft-tissue]
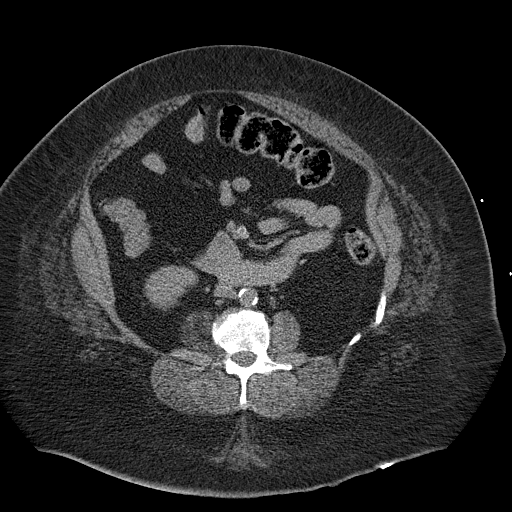
[im 68/102  soft-tissue]
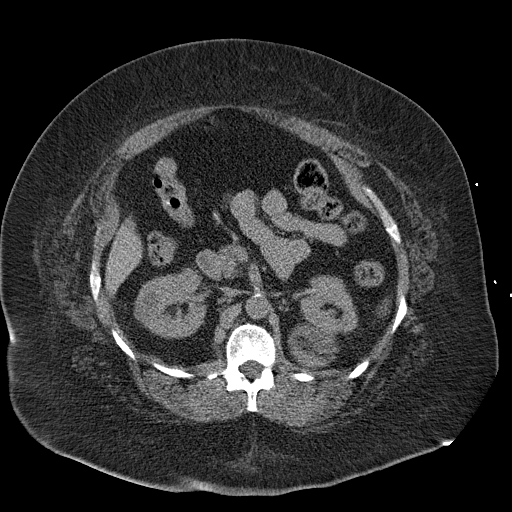
[im 68/102  bone]
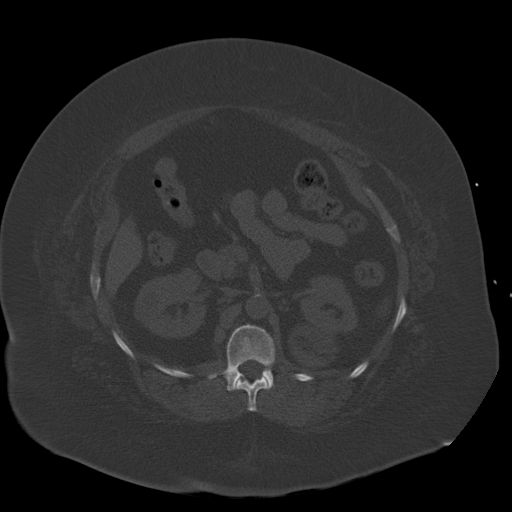
[im 72/102  soft-tissue]
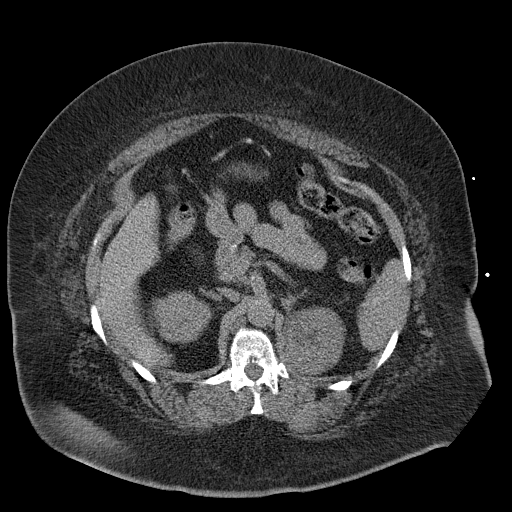
[im 80/102  soft-tissue]
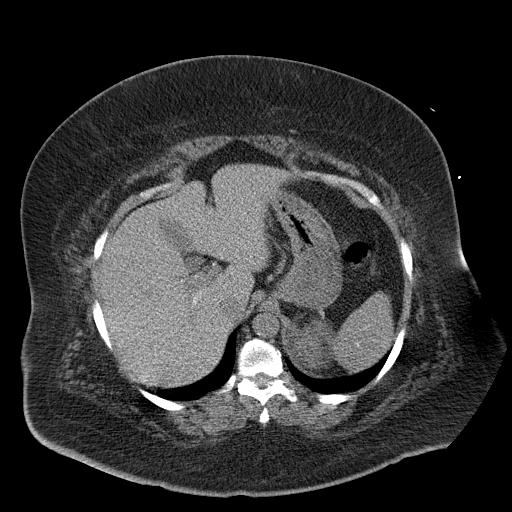
[im 85/102  lung]
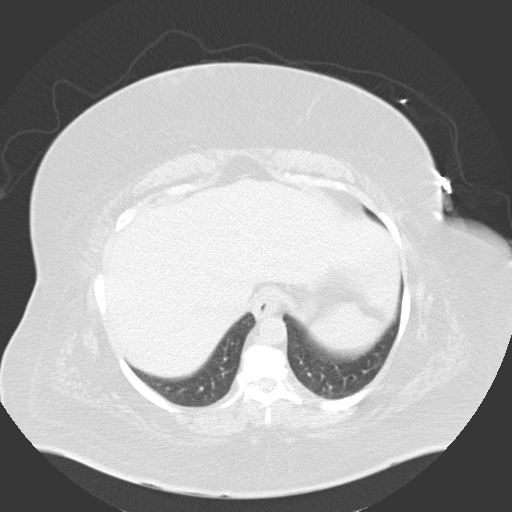
[im 89/102  soft-tissue]
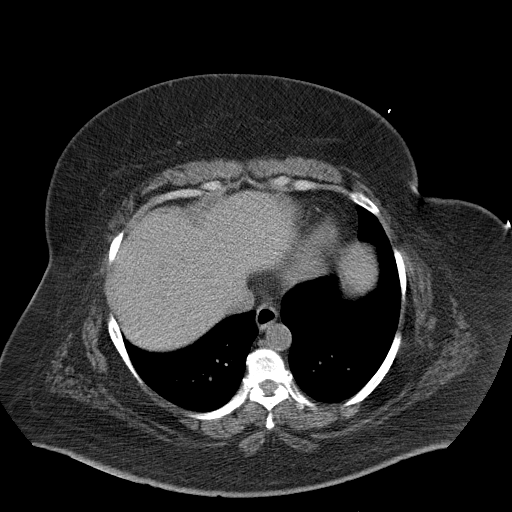
[im 89/102  lung]
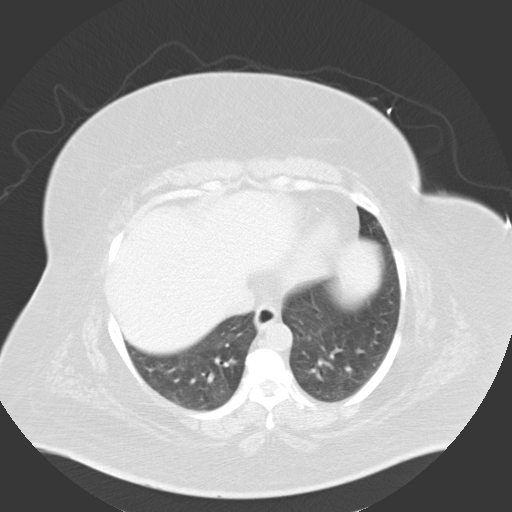
[im 93/102  lung]
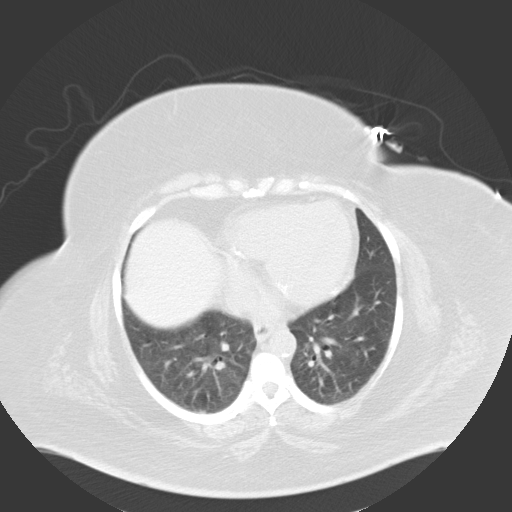
[im 97/102  soft-tissue]
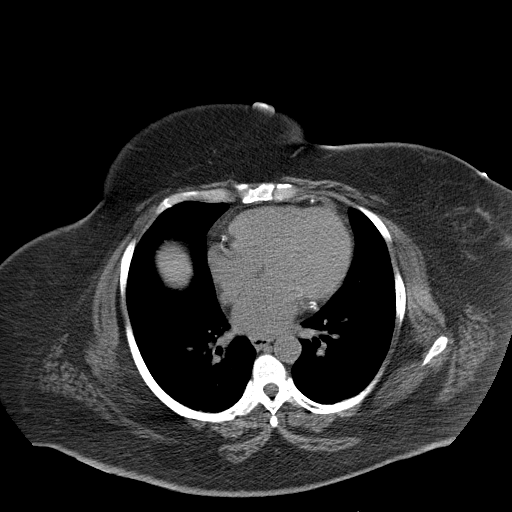
[im 97/102  lung]
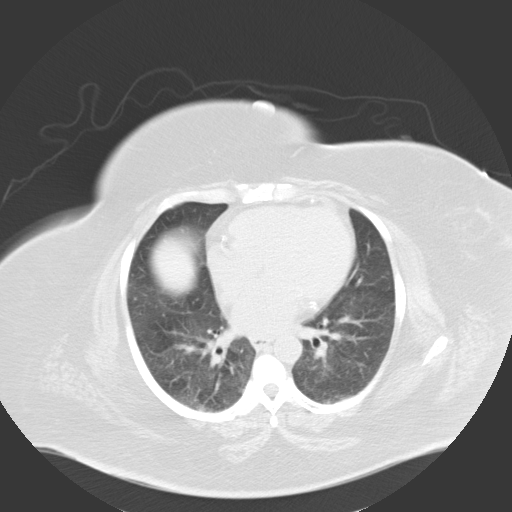

[15 of 32 positions shown; findings below may reference images not displayed]

FINDINGS: Lung bases are free of acute infiltrate or sizable effusion. A focus
of subpleural fat is noted laterally on the left. Some associated
scarring is noted.

The liver, spleen, right adrenal gland and pancreas are within
normal limits. Gallbladder is well distended with multiple small
stones within. A 6.2 cm left adrenal mass containing fat is noted
consistent with a myelolipoma.

Kidneys are well visualized bilaterally and reveal cystic change on
the right. No calculi or obstructive changes are noted. Mild
aortoiliac calcifications are seen. The appendix is within normal
limits. Fat containing umbilical hernia is noted without
incarceration.

Bladder is well distended. The uterus has been surgically removed.
Stable calcification is noted in the right hemipelvis which appears
benign in etiology. The bony structures are within normal limits.
IMPRESSION: Cholelithiasis without complicating factors.

Stable left adrenal mass consistent with a myelo lipoma.

Renal cystic change.

Stable benign calcification in the right hemipelvis.

No acute abnormality is noted.

## 2017-07-09 ENCOUNTER — Other Ambulatory Visit: Payer: Self-pay

## 2017-07-09 DIAGNOSIS — N6311 Unspecified lump in the right breast, upper outer quadrant: Secondary | ICD-10-CM

## 2017-08-21 ENCOUNTER — Ambulatory Visit
Admission: RE | Admit: 2017-08-21 | Discharge: 2017-08-21 | Disposition: A | Discharge status: Home / Self Care | Payer: Medicare (Managed Care) | Source: Ambulatory Visit | Attending: General Surgery | Admitting: General Surgery

## 2017-08-21 DIAGNOSIS — N6312 Unspecified lump in the right breast, upper inner quadrant: Secondary | ICD-10-CM | POA: Diagnosis not present

## 2017-08-21 DIAGNOSIS — R921 Mammographic calcification found on diagnostic imaging of breast: Secondary | ICD-10-CM | POA: Diagnosis not present

## 2017-08-21 DIAGNOSIS — N6311 Unspecified lump in the right breast, upper outer quadrant: Secondary | ICD-10-CM

## 2017-08-21 DIAGNOSIS — N6313 Unspecified lump in the right breast, lower outer quadrant: Secondary | ICD-10-CM | POA: Insufficient documentation

## 2017-08-21 DIAGNOSIS — N6314 Unspecified lump in the right breast, lower inner quadrant: Secondary | ICD-10-CM | POA: Diagnosis not present

## 2017-08-28 ENCOUNTER — Ambulatory Visit (INDEPENDENT_AMBULATORY_CARE_PROVIDER_SITE_OTHER): Payer: Medicare (Managed Care) | Admitting: General Surgery

## 2017-08-28 ENCOUNTER — Encounter: Payer: Self-pay | Admitting: General Surgery

## 2017-08-28 VITALS — BP 152/84 | HR 90 | Resp 18 | Ht 62.0 in | Wt 288.0 lb

## 2017-08-28 DIAGNOSIS — N6311 Unspecified lump in the right breast, upper outer quadrant: Secondary | ICD-10-CM

## 2017-08-28 DIAGNOSIS — R920 Mammographic microcalcification found on diagnostic imaging of breast: Secondary | ICD-10-CM | POA: Diagnosis not present

## 2017-08-28 NOTE — Patient Instructions (Addendum)
The patient is aware to call back for any questions or new concerns.  Recommend stereotatic right breast biopsy with [facility]. Recommend excisional biopsy right breast mass with Same Day Surgery.

## 2017-08-28 NOTE — Progress Notes (Signed)
Patient ID: Barbara Farley, female   DOB: 1945-12-20, 72 y.o.   MRN: [id number]  Chief Complaint  Patient presents with  . Follow-up    HPI Barbara Farley is a 72 y.o. female who presents for a breast evaluation. The most recent right breast  mammogram was done on 08/21/2017 .  Patient does perform regular self breast checks and gets regular mammograms done.   She denies any new breat issues but she states [facility] wants her to have surgery on the right breast.  HPI  Past Medical History:  Diagnosis Date  . Anemia   . CAD (coronary artery disease)   . CHF (congestive heart failure) ([place])   . COPD (chronic obstructive pulmonary disease) ([place])   . Diabetes mellitus without complication ([place])   . Hx of BKA, left ([place]) 2008?  Marland Kitchen Hyperlipemia   . Hypertension   . Morbid obesity ([place])   . Seizures ([place])   . Sleep apnea     Past Surgical History:  Procedure Laterality Date  . BELOW KNEE LEG AMPUTATION Left 2008  . BREAST BIOPSY Right 03/20/2017   3:00 FIBROEPITHELIAL LESION coil shape  . BREAST BIOPSY Right 03/20/2017   9:30 wing shape, fibroadenomatoid  . BREAST BIOPSY Right 02/28/2017   3:30 LIQ 2cmfn ribbon shape, FIBROEPITHELIAL LESION    Family History  Problem Relation Age of Onset  . Diabetes Unknown   . Breast cancer Mother        in early 23's    Social History Social History   Tobacco Use  . Smoking status: Current Every Day Smoker    Types: Cigarettes  . Smokeless tobacco: Never Used  Substance Use Topics  . Alcohol use: No    Alcohol/week: 0.0 oz  . Drug use: No    No Known Allergies  Current Outpatient Medications  Medication Sig Dispense Refill  . amLODipine-atorvastatin (CADUET) 5-40 MG tablet Take 1 tablet by mouth daily.    Marland Kitchen aspirin EC 81 MG tablet Take 81 mg by mouth daily.    Marland Kitchen docusate sodium (COLACE) 100 MG capsule Take 100 mg by mouth daily.    . furosemide (LASIX) 20 MG tablet Take 60 mg by mouth every morning.    . isosorbide  mononitrate (IMDUR) 30 MG 24 hr tablet Take 30 mg by mouth daily.    Marland Kitchen lisinopril (PRINIVIL,ZESTRIL) 40 MG tablet Take 40 mg by mouth daily.     Marland Kitchen oxybutynin (DITROPAN-XL) 10 MG 24 hr tablet Take 10 mg by mouth daily.    Marland Kitchen PARoxetine (PAXIL) 20 MG tablet Take 20 mg by mouth daily.    . ranitidine (ZANTAC) 150 MG tablet Take 150 mg by mouth 2 (two) times daily.    Marland Kitchen senna (SENOKOT) 8.6 MG TABS tablet Take 2 tablets by mouth at bedtime.    . Vitamin D, Ergocalciferol, (DRISDOL) 50000 units CAPS capsule Take 50,000 Units by mouth every 30 (thirty) days. Take on the first Monday of each month     No current facility-administered medications for this visit.     Review of Systems Review of Systems  Constitutional: Negative.   Respiratory: Negative.   Cardiovascular: Negative.     Blood pressure (!) 152/84, pulse 90, resp. rate 18, height 5\' 2"  (1.575 m), weight 288 lb (130.6 kg), SpO2 95 %.  Physical Exam Physical Exam  Constitutional: She is oriented to person, place, and time. She appears well-developed and well-nourished.  HENT:  Mouth/Throat: Oropharynx is clear and moist.  Eyes: Conjunctivae are normal. No scleral icterus.  Neck: Neck supple.  Cardiovascular: Normal rate, regular rhythm and normal heart sounds.  Pulmonary/Chest: Effort normal and breath sounds normal. Right breast exhibits no inverted nipple, no mass, no nipple discharge, no skin change and no tenderness. Left breast exhibits no inverted nipple, no mass, no nipple discharge, no skin change and no tenderness.    Lymphadenopathy:    She has no cervical adenopathy.    She has no axillary adenopathy.  Neurological: She is alert and oriented to person, place, and time.  Skin: Skin is warm and dry.  Psychiatric: Her behavior is normal.    Data Reviewed See review of multiple [facility] biopsies on the patient's May 17, 2017 note.  Recent mammograms are reviewed.  New foci of microcalcifications as well as enlargement  of the previously biopsied fibroepithelial lesion.  Assessment    The microcalcifications are new and warrant biopsy.  With the patient's amputation, she will be best served by the    Plan  Recommend stereotatic right breast biopsy with [facility].  Seated biopsy completed by the radiology service.  Assuming a benign result, we will make arrangements for excision of the enlarging mass in the right breast and likely core biopsy of the other nodules at the same setting as an outpatient. Recommend excisional biopsy right breast mass with Same Day Surgery.      HPI, Physical Exam, Assessment and Plan have been scribed under the direction and in the presence of Robert Bellow, MD. Karie Fetch, RN  I have completed the exam and reviewed the above documentation for accuracy and completeness.  I agree with the above.  Haematologist has been used and any errors in dictation or transcription are unintentional.  Hervey Ard, M.D., F.A.C.S.  Forest Gleason Tylar Merendino 08/28/2017, 9:27 PM

## 2017-08-30 ENCOUNTER — Other Ambulatory Visit: Payer: Self-pay | Admitting: Internal Medicine

## 2017-08-30 ENCOUNTER — Telehealth: Payer: Self-pay | Admitting: *Deleted

## 2017-08-30 DIAGNOSIS — R928 Other abnormal and inconclusive findings on diagnostic imaging of breast: Secondary | ICD-10-CM

## 2017-08-30 DIAGNOSIS — R921 Mammographic calcification found on diagnostic imaging of breast: Secondary | ICD-10-CM

## 2017-08-30 NOTE — Telephone Encounter (Signed)
Charlena Cross from [facility] [phone]) called and is ready to schedule the patient for surgery on her right breast.

## 2017-09-06 ENCOUNTER — Ambulatory Visit: Payer: Medicare (Managed Care)

## 2017-09-12 ENCOUNTER — Ambulatory Visit
Admission: RE | Admit: 2017-09-12 | Discharge: 2017-09-12 | Disposition: A | Discharge status: Home / Self Care | Payer: Medicare (Managed Care) | Source: Ambulatory Visit | Attending: Internal Medicine | Admitting: Internal Medicine

## 2017-09-12 DIAGNOSIS — R928 Other abnormal and inconclusive findings on diagnostic imaging of breast: Secondary | ICD-10-CM

## 2017-09-12 DIAGNOSIS — D241 Benign neoplasm of right breast: Secondary | ICD-10-CM | POA: Diagnosis not present

## 2017-09-12 DIAGNOSIS — R921 Mammographic calcification found on diagnostic imaging of breast: Secondary | ICD-10-CM

## 2017-09-12 DIAGNOSIS — D214 Benign neoplasm of connective and other soft tissue of abdomen: Secondary | ICD-10-CM | POA: Diagnosis present

## 2017-09-12 HISTORY — PX: BREAST BIOPSY: SHX20

## 2017-09-13 LAB — SURGICAL PATHOLOGY

## 2017-10-02 ENCOUNTER — Telehealth: Payer: Self-pay | Admitting: General Surgery

## 2017-10-02 NOTE — Telephone Encounter (Signed)
Stereotactic biopsy was benign.  Enlarging right upper lobe lung mass without evaluation in some time.  We will arrange for noncontrast CT.  Office follow-up afterwards to arrange/coordinate biopsy of an enlarging fibroepithelial lesion from the right breast.

## 2017-10-02 NOTE — Telephone Encounter (Signed)
Ebony with [facility] called to see if the path from 09-12-17 was in. It is in Epic. Please advise what the next step is. Please call Ebony at [phone].

## 2017-10-03 NOTE — Telephone Encounter (Signed)
Message left for [facility] with [facility] to call the office back.   We need to notify her of patient's stereo results.   Also, need to inform her that Dr. Bary Castilla wants to order a CT chest WO contrast to follow up on the patient's right lung nodule. We need to see if patient's PCP will be ordering this or if we need to put the order in and they call to arrange for patient, etc (Patient is covered under [facility] and needs approval for all office visits, tests, etc; we need to see how they handle approval for CT and if Medicaid has to approve as well).   Also, need to arrange for office visit follow up after CT scan to discuss right breast biopsy.

## 2017-10-15 NOTE — Telephone Encounter (Signed)
Ebony with [facility] notified of biopsy results. Also notified that Dr Bary Castilla would like the patient to have a non contrast CT of the Chest to follow up on a lung nodule. He would like to see here in office after that has been completed to discuss a breast biopsy for the fibroepithelial lesion of the right breast. Charlena Cross will notify the patient's provider to get this arranged and will contact us back to schedule a follow up for the patient.

## 2017-11-23 ENCOUNTER — Other Ambulatory Visit: Payer: Self-pay | Admitting: Internal Medicine

## 2017-11-23 DIAGNOSIS — D241 Benign neoplasm of right breast: Secondary | ICD-10-CM

## 2017-11-23 NOTE — Telephone Encounter (Signed)
Called again to [facility] and spoke with Charlena Cross and asked if the patient had had her CT of the chest done. She says that the provider was not notified of this but she will send a message to them about this today and should get back with [place] next week. She is aware that Dr Bary Castilla wants to see the patient in office after the CT for discussion of a right breast biopsy of the breast lesion.

## 2017-12-06 ENCOUNTER — Ambulatory Visit
Admission: RE | Admit: 2017-12-06 | Discharge: 2017-12-06 | Disposition: A | Discharge status: Home / Self Care | Payer: Medicare (Managed Care) | Source: Ambulatory Visit | Attending: Internal Medicine | Admitting: Internal Medicine

## 2017-12-06 DIAGNOSIS — N6310 Unspecified lump in the right breast, unspecified quadrant: Secondary | ICD-10-CM | POA: Diagnosis not present

## 2017-12-06 DIAGNOSIS — D1779 Benign lipomatous neoplasm of other sites: Secondary | ICD-10-CM | POA: Insufficient documentation

## 2017-12-06 DIAGNOSIS — I2510 Atherosclerotic heart disease of native coronary artery without angina pectoris: Secondary | ICD-10-CM | POA: Diagnosis not present

## 2017-12-06 DIAGNOSIS — R918 Other nonspecific abnormal finding of lung field: Secondary | ICD-10-CM | POA: Diagnosis not present

## 2017-12-06 DIAGNOSIS — I700 Atherosclerosis of aorta: Secondary | ICD-10-CM | POA: Diagnosis not present

## 2017-12-06 DIAGNOSIS — D241 Benign neoplasm of right breast: Secondary | ICD-10-CM | POA: Diagnosis present

## 2018-01-03 ENCOUNTER — Ambulatory Visit (INDEPENDENT_AMBULATORY_CARE_PROVIDER_SITE_OTHER): Payer: Medicare (Managed Care) | Admitting: General Surgery

## 2018-01-03 ENCOUNTER — Encounter: Payer: Self-pay | Admitting: General Surgery

## 2018-01-03 VITALS — BP 132/74 | HR 96 | Resp 18 | Ht 66.0 in | Wt 290.0 lb

## 2018-01-03 DIAGNOSIS — N6311 Unspecified lump in the right breast, upper outer quadrant: Secondary | ICD-10-CM | POA: Diagnosis not present

## 2018-01-03 DIAGNOSIS — R911 Solitary pulmonary nodule: Secondary | ICD-10-CM

## 2018-01-03 NOTE — Progress Notes (Signed)
Patient ID: Barbara Farley, female   DOB: 09-Apr-1946, 72 y.o.   MRN: [id number]  Chief Complaint  Patient presents with  . Follow-up    HPI Barbara Farley is a 72 y.o. female here today to discuss ct scan done on 12/06/2017. Patient states he is doing well.  HPI  Past Medical History:  Diagnosis Date  . Anemia   . CAD (coronary artery disease)   . CHF (congestive heart failure) ([place])   . COPD (chronic obstructive pulmonary disease) ([place])   . Diabetes mellitus without complication ([place])   . Hx of BKA, left ([place]) 2008?  Marland Kitchen Hyperlipemia   . Hypertension   . Morbid obesity ([place])   . Seizures ([place])   . Sleep apnea     Past Surgical History:  Procedure Laterality Date  . BELOW KNEE LEG AMPUTATION Left 2008  . BREAST BIOPSY Right 03/20/2017   3:00 FIBROEPITHELIAL LESION coil shape  . BREAST BIOPSY Right 03/20/2017   9:30 wing shape, fibroadenomatoid  . BREAST BIOPSY Right 02/28/2017   3:30 LIQ 2cmfn ribbon shape, FIBROEPITHELIAL LESION  . BREAST BIOPSY Right 09/12/2017   Right  LIQ, x shape marker right breast biopsy  with fibroadenomatous changes.    Family History  Problem Relation Age of Onset  . Diabetes Unknown   . Breast cancer Mother        in early 20's    Social History Social History   Tobacco Use  . Smoking status: Current Every Day Smoker    Types: Cigarettes  . Smokeless tobacco: Never Used  Substance Use Topics  . Alcohol use: No    Alcohol/week: 0.0 standard drinks  . Drug use: No    No Known Allergies  Current Outpatient Medications  Medication Sig Dispense Refill  . amLODipine-atorvastatin (CADUET) 5-40 MG tablet Take 1 tablet by mouth daily.    Marland Kitchen aspirin EC 81 MG tablet Take 81 mg by mouth daily.    Marland Kitchen docusate sodium (COLACE) 100 MG capsule Take 100 mg by mouth daily.    . furosemide (LASIX) 20 MG tablet Take 60 mg by mouth every morning.    . insulin aspart protamine- aspart (NOVOLOG MIX 70/30) (70-30) 100 UNIT/ML injection Inject into the  skin.    Marland Kitchen isosorbide mononitrate (IMDUR) 30 MG 24 hr tablet Take 30 mg by mouth daily.    Marland Kitchen lisinopril (PRINIVIL,ZESTRIL) 40 MG tablet Take 40 mg by mouth daily.     Marland Kitchen oxybutynin (DITROPAN-XL) 10 MG 24 hr tablet Take 10 mg by mouth daily.    Marland Kitchen PARoxetine (PAXIL) 20 MG tablet Take 20 mg by mouth daily.    . ranitidine (ZANTAC) 150 MG tablet Take 150 mg by mouth 2 (two) times daily.    Marland Kitchen senna (SENOKOT) 8.6 MG TABS tablet Take 2 tablets by mouth at bedtime.    . Vitamin D, Ergocalciferol, (DRISDOL) 50000 units CAPS capsule Take 50,000 Units by mouth every 30 (thirty) days. Take on the first Monday of each month     No current facility-administered medications for this visit.     Review of Systems Review of Systems  Constitutional: Negative.   Respiratory: Negative.   Cardiovascular: Negative.     Blood pressure 132/74, pulse 96, resp. rate 18, height 5\' 6"  (1.676 m), weight 290 lb (131.5 kg).  Physical Exam Physical Exam  Constitutional: She is oriented to person, place, and time. She appears well-developed and well-nourished.  Neurological: She is alert and oriented to person, place,  and time.  Skin: Skin is warm and dry.    Data Reviewed CT of December 06, 2017 reviewed. 1. Overall, minimal change in the size and appearance of a multiple bilateral solid, semi-solid and partially cavitary pulmonary nodules compared to prior imaging from December of 2017. The minimal change over 1.5 years suggests an indolent underlying process. Specifically, the largest solid nodule with a lobulated margin and peripheral spiculations is completely unchanged at 2 cm. A subpleural nodule in the right middle lobe has increased minimally from 0.7 cm to 0.9 cm. Additionally, a somewhat cavitary semi-solid lesion in the periphery of the left upper lobe may be slightly larger at 2.5 x 1.6 cm compared to 2.6 x 1.1 cm previously. 2. Enlarging left adrenal myelolipoma. 3. Extensive coronary artery  calcifications. 4.  Aortic Atherosclerosis (ICD10-170.0) 5. Borderline enlarged pulmonary artery suggests the possibility of developing pulmonary arterial hypertension. 6. Stable right breast soft tissue nodule.  Right breast biopsy completed by the radiology service on Sep 12, 2017 showed fibroadenomatous changes without malignancy.  Assessment    Stable chest CT, multiple pulmonary nodules.  Multiple previous biopsies without malignant pathologic diagnosis.  Plan  Patient to return in April 2020  in bilateral diagnotic mammogram The patient is aware to call back for any questions or concerns.   HPI, Physical Exam, Assessment and Plan have been scribed under the direction and in the presence of Hervey Ard, MD.  Gaspar Cola, CMA  I have completed the exam and reviewed the above documentation for accuracy and completeness.  I agree with the above.  Haematologist has been used and any errors in dictation or transcription are unintentional.  Hervey Ard, M.D., F.A.C.S.   Forest Gleason Isabella Roemmich 01/04/2018, 7:02 AM

## 2018-01-03 NOTE — Patient Instructions (Signed)
Patient to return in April 2020  in bilateral diagnotic ma mammogram The patient is aware to call back for any questions or concerns.

## 2018-01-04 ENCOUNTER — Encounter: Payer: Self-pay | Admitting: General Surgery

## 2018-04-11 ENCOUNTER — Emergency Department: Payer: Medicare (Managed Care)

## 2018-04-11 ENCOUNTER — Other Ambulatory Visit: Payer: Self-pay

## 2018-04-11 ENCOUNTER — Encounter: Payer: Self-pay | Admitting: Emergency Medicine

## 2018-04-11 ENCOUNTER — Emergency Department
Admission: EM | Admit: 2018-04-11 | Discharge: 2018-04-11 | Disposition: A | Discharge status: Home / Self Care | Payer: Medicare (Managed Care) | Attending: Emergency Medicine | Admitting: Emergency Medicine

## 2018-04-11 DIAGNOSIS — I2510 Atherosclerotic heart disease of native coronary artery without angina pectoris: Secondary | ICD-10-CM | POA: Insufficient documentation

## 2018-04-11 DIAGNOSIS — Z89512 Acquired absence of left leg below knee: Secondary | ICD-10-CM | POA: Insufficient documentation

## 2018-04-11 DIAGNOSIS — J449 Chronic obstructive pulmonary disease, unspecified: Secondary | ICD-10-CM | POA: Insufficient documentation

## 2018-04-11 DIAGNOSIS — Z79899 Other long term (current) drug therapy: Secondary | ICD-10-CM | POA: Diagnosis not present

## 2018-04-11 DIAGNOSIS — I110 Hypertensive heart disease with heart failure: Secondary | ICD-10-CM | POA: Diagnosis not present

## 2018-04-11 DIAGNOSIS — R4182 Altered mental status, unspecified: Secondary | ICD-10-CM | POA: Diagnosis present

## 2018-04-11 DIAGNOSIS — Z7982 Long term (current) use of aspirin: Secondary | ICD-10-CM | POA: Insufficient documentation

## 2018-04-11 DIAGNOSIS — Z794 Long term (current) use of insulin: Secondary | ICD-10-CM | POA: Insufficient documentation

## 2018-04-11 DIAGNOSIS — E162 Hypoglycemia, unspecified: Secondary | ICD-10-CM

## 2018-04-11 DIAGNOSIS — I509 Heart failure, unspecified: Secondary | ICD-10-CM | POA: Diagnosis not present

## 2018-04-11 DIAGNOSIS — E11649 Type 2 diabetes mellitus with hypoglycemia without coma: Secondary | ICD-10-CM | POA: Insufficient documentation

## 2018-04-11 DIAGNOSIS — F17210 Nicotine dependence, cigarettes, uncomplicated: Secondary | ICD-10-CM | POA: Insufficient documentation

## 2018-04-11 LAB — URINALYSIS, COMPLETE (UACMP) WITH MICROSCOPIC
BILIRUBIN URINE: NEGATIVE
Bacteria, UA: NONE SEEN
GLUCOSE, UA: NEGATIVE mg/dL
HGB URINE DIPSTICK: NEGATIVE
Ketones, ur: NEGATIVE mg/dL
Nitrite: NEGATIVE
Protein, ur: >=300 mg/dL — AB
Specific Gravity, Urine: 1.013 (ref 1.005–1.030)
pH: 7 (ref 5.0–8.0)

## 2018-04-11 LAB — BASIC METABOLIC PANEL
Anion gap: 10 (ref 5–15)
BUN: 23 mg/dL (ref 8–23)
CO2: 29 mmol/L (ref 22–32)
Calcium: 8.8 mg/dL — ABNORMAL LOW (ref 8.9–10.3)
Chloride: 103 mmol/L (ref 98–111)
Creatinine, Ser: 1.62 mg/dL — ABNORMAL HIGH (ref 0.44–1.00)
GFR calc Af Amer: 37 mL/min — ABNORMAL LOW (ref >60)
GFR calc non Af Amer: 32 mL/min — ABNORMAL LOW (ref >60)
Glucose, Bld: 118 mg/dL — ABNORMAL HIGH (ref 70–99)
POTASSIUM: 3.8 mmol/L (ref 3.5–5.1)
Sodium: 142 mmol/L (ref 135–145)

## 2018-04-11 LAB — CBC
HEMATOCRIT: 43.2 % (ref 36.0–46.0)
HEMOGLOBIN: 13.1 g/dL (ref 12.0–15.0)
MCH: 27.8 pg (ref 26.0–34.0)
MCHC: 30.3 g/dL (ref 30.0–36.0)
MCV: 91.7 fL (ref 80.0–100.0)
Platelets: 252 10*3/uL (ref 150–400)
RBC: 4.71 MIL/uL (ref 3.87–5.11)
RDW: 14.2 % (ref 11.5–15.5)
WBC: 7.3 10*3/uL (ref 4.0–10.5)
nRBC: 0 % (ref 0.0–0.2)

## 2018-04-11 LAB — GLUCOSE, CAPILLARY
Glucose-Capillary: 73 mg/dL (ref 70–99)
Glucose-Capillary: 158 mg/dL — ABNORMAL HIGH (ref 70–99)

## 2018-04-11 NOTE — Discharge Instructions (Addendum)
You are evaluated for low blood sugar this morning.  Please make sure that your Lantus dose is decreased to 40 units, this is essentially half of the initial-prior dosing of 85 units, until there are any changes with your primary care doctor.  Return to the emergency department immediately for any worsening condition including confusion altered mental status, fever, chest pain, weakness, numbness, or any other symptoms concerning to you.

## 2018-04-11 NOTE — ED Notes (Signed)
Patient departed with [facility] nurse in [place]. Pt provided disposable scrubs and grip socks for trip home.

## 2018-04-11 NOTE — ED Triage Notes (Signed)
Pt presents to ED via [facility] from home c/o AMS. EMS report they were called via [facility] button. Pt found on floor, states she fell out of bed. Unsure what time fall happened. EMS states they were also called 04/07/18 for same and pt at that time was found to have CBG 20. Today initial CBG 70, only increased to 78 after 1 tube of oral glucose and 25 glucagon. Pt is confused on arrival, speaks with gurgling sound. Pt lives alone with home health.   EMS also report RA sat 78%. Pt states she only uses O2 at night. 98% on 4L n/c.

## 2018-04-11 NOTE — ED Provider Notes (Signed)
[facility] Emergency Department Provider Note ____________________________________________   I have reviewed the triage vital signs and the triage nursing note.  HISTORY  Chief Complaint Altered Mental Status   Historian Level 5 Caveat History Limited by patient is a poor historian Nurse from pace provide some additional baseline and history  HPI Barbara Farley is a 72 y.o. female presents from home, apparently per EMS she had pressed [employer] alert.  Patient really cannot tell me how EMS got there.  Patient is diabetic and apparently on Monday had altered mental status associated with hypoglycemia down into the 20s.  She did not come to the ER, however the pacer nurse indicates that her Lantus was decreased from 85 units to 70 units.  Patient initially when I asked her about this seemed unaware.  And then later she stated that she thought that was true and that her strings were prepared for her.  She takes her Lantus in the morning and so last dose would have been yesterday morning about 24 hours ago.  She reports decreased p.o. intake because she "has no appetite.  "  Patient was given glucose in glucagon in the field for blood sugar in the 70s.  She is denying fever.  Denies headache.  Denies coughing or trouble breathing.  Denies abdominal pain.     Past Medical History:  Diagnosis Date  . Anemia   . CAD (coronary artery disease)   . CHF (congestive heart failure) ([place])   . COPD (chronic obstructive pulmonary disease) ([place])   . Diabetes mellitus without complication ([place])   . Hx of BKA, left ([place]) 2008?  Marland Kitchen Hyperlipemia   . Hypertension   . Morbid obesity ([place])   . Seizures ([place])   . Sleep apnea     Patient Active Problem List   Diagnosis Date Noted  . Mass of upper outer quadrant of right breast 05/18/2017  . Chest pain with low risk for cardiac etiology 07/30/2015  . SOB (shortness of breath) on exertion 07/30/2015  . Multiple  pulmonary nodules 07/21/2015  . CHF (congestive heart failure) ([place]) 06/18/2015    Past Surgical History:  Procedure Laterality Date  . BELOW KNEE LEG AMPUTATION Left 2008  . BREAST BIOPSY Right 03/20/2017   3:00 FIBROEPITHELIAL LESION coil shape  . BREAST BIOPSY Right 03/20/2017   9:30 wing shape, fibroadenomatoid  . BREAST BIOPSY Right 02/28/2017   3:30 LIQ 2cmfn ribbon shape, FIBROEPITHELIAL LESION  . BREAST BIOPSY Right 09/12/2017   Right  LIQ, x shape marker right breast biopsy  with fibroadenomatous changes.    Prior to Admission medications   Medication Sig Start Date End Date Taking? Authorizing Provider  acetaminophen (TYLENOL) 325 MG tablet Take 650 mg by mouth every 6 (six) hours as needed.   Yes [provider]  aspirin EC 81 MG tablet Take 81 mg by mouth daily.   Yes [provider]  atorvastatin (LIPITOR) 40 MG tablet Take 40 mg by mouth daily.   Yes [provider]  calcium carbonate (TUMS - DOSED IN MG ELEMENTAL CALCIUM) 500 MG chewable tablet Chew 1 tablet by mouth 2 (two) times daily as needed for indigestion or heartburn.   Yes [provider]  cholecalciferol (VITAMIN D3) 25 MCG (1000 UT) tablet Take 2,000 Units by mouth daily.   Yes [provider]  furosemide (LASIX) 20 MG tablet Take 60 mg by mouth every morning.   Yes [provider]  glucagon (GLUCAGON EMERGENCY) 1 MG  injection Inject 1 mg into the vein once as needed.   Yes [provider]  glucose 4 GM chewable tablet Chew 1 tablet by mouth once. Take 1 tablet by mouth for glucose less than 70, repeat every 5 mintues until blood glucose grater than 70   Yes [provider]  insulin glargine (LANTUS) 100 UNIT/ML injection Inject 70 Units into the skin daily.   Yes [provider]  isosorbide mononitrate (IMDUR) 60 MG 24 hr tablet Take 60 mg by mouth daily.    Yes [provider]  lisinopril (PRINIVIL,ZESTRIL) 40 MG tablet  Take 40 mg by mouth daily.    Yes [provider]  nitroGLYCERIN (NITROSTAT) 0.4 MG SL tablet Place 0.4 mg under the tongue every 5 (five) minutes as needed for chest pain.   Yes [provider]  PARoxetine (PAXIL) 20 MG tablet Take 20 mg by mouth daily.   Yes [provider]  polyethylene glycol powder (GLYCOLAX/MIRALAX) powder Take 17 g by mouth daily.   Yes [provider]  ranitidine (ZANTAC) 150 MG tablet Take 150 mg by mouth 2 (two) times daily.   Yes [provider]  tiotropium (SPIRIVA) 18 MCG inhalation capsule Place 18 mcg into inhaler and inhale daily.   Yes [provider]  amLODipine-atorvastatin (CADUET) 5-40 MG tablet Take 1 tablet by mouth daily.    [provider]  docusate sodium (COLACE) 100 MG capsule Take 100 mg by mouth daily.    [provider]  oxybutynin (DITROPAN-XL) 10 MG 24 hr tablet Take 10 mg by mouth daily.    [provider]  senna (SENOKOT) 8.6 MG TABS tablet Take 2 tablets by mouth at bedtime.    [provider]  Vitamin D, Ergocalciferol, (DRISDOL) 50000 units CAPS capsule Take 50,000 Units by mouth every 30 (thirty) days. Take on the first Monday of each month    [provider]    No Known Allergies  Family History  Problem Relation Age of Onset  . Diabetes Unknown   . Breast cancer Mother        in early 36's    Social History Social History   Tobacco Use  . Smoking status: Current Every Day Smoker    Types: Cigarettes  . Smokeless tobacco: Never Used  Substance Use Topics  . Alcohol use: No    Alcohol/week: 0.0 standard drinks  . Drug use: No    Review of Systems  Constitutional: Negative for fever. Eyes: Negative for visual changes. ENT: Negative for sore throat. Cardiovascular: Negative for chest pain. Respiratory: Negative for shortness of breath. Gastrointestinal: Negative for abdominal pain, vomiting and diarrhea. Genitourinary:  Negative for dysuria. Musculoskeletal: Negative for back pain. Skin: Negative for rash. Neurological: Negative for headache.  ____________________________________________   PHYSICAL EXAM:  VITAL SIGNS: ED Triage Vitals [04/11/18 0950]  Enc Vitals Group     BP (!) 186/86     Pulse Rate 88     Resp 18     Temp      Temp src      SpO2 (!) 78 %     Weight 280 lb (127 kg)     Height 5\' 3"  (1.6 m)     Head Circumference      Peak Flow      Pain Score 0     Pain Loc      Pain Edu?      Excl. in [place]?      Constitutional:  Alert and operative, oriented but she is a poor historian.  HEENT      Head: Normocephalic and atraumatic.      Eyes: Conjunctivae are normal. Pupils equal and round.       Ears:         Nose: No congestion/rhinnorhea.      Mouth/Throat: Mucous membranes are moist.      Neck: No stridor. Cardiovascular/Chest: Normal rate, regular rhythm.  No murmurs, rubs, or gallops. Respiratory: Normal respiratory effort without tachypnea nor retractions. Breath sounds are clear and equal bilaterally. No wheezes/rales/rhonchi. Gastrointestinal: Soft. No distention, no guarding, no rebound. Nontender.  Obese Genitourinary/rectal:Deferred Musculoskeletal: Nontender with normal range of motion in all extremities. No joint effusions.  No lower extremity tenderness.  Left AKA Neurologic: No facial droop or slurred speech.  Historian.  No gross or focal neurologic deficits are appreciated. Skin:  Skin is warm, dry and intact. No rash noted. Psychiatric: Mood and affect are normal.  No agitation.   ____________________________________________  LABS (pertinent positives/negatives) I, Lisa Roca, MD the attending physician have reviewed the labs noted below.  Labs Reviewed  URINALYSIS, COMPLETE (UACMP) WITH MICROSCOPIC - Abnormal; Notable for the following components:      Result Value   Color, Urine AMBER (*)    APPearance CLOUDY (*)    Protein, ur >=300 (*)     Leukocytes, UA LARGE (*)    WBC, UA >50 (*)    All other components within normal limits  BASIC METABOLIC PANEL - Abnormal; Notable for the following components:   Glucose, Bld 118 (*)    Creatinine, Ser 1.62 (*)    Calcium 8.8 (*)    GFR calc non Af Amer 32 (*)    GFR calc Af Amer 37 (*)    All other components within normal limits  GLUCOSE, CAPILLARY - Abnormal; Notable for the following components:   Glucose-Capillary 158 (*)    All other components within normal limits  URINE CULTURE  GLUCOSE, CAPILLARY  CBC  CBG MONITORING, ED    ____________________________________________    EKG I, Lisa Roca, MD, the attending physician have personally viewed and interpreted all ECGs.  82 bpm.  Normal sinus rhythm.  Right axis deviation.  Nonspecific intraventricular conduction delay.  Nonspecific T wave ____________________________________________  RADIOLOGY   CT without contrast:  IMPRESSION: 1. No intracranial trauma. 2. No acute intracranial findings. 3. Mild white matter microvascular disease. __________________________________________  PROCEDURES  Procedure(s) performed: None  Procedures  Critical Care performed: None   ____________________________________________  ED COURSE / ASSESSMENT AND PLAN  Pertinent labs & imaging results that were available during my care of the patient were reviewed by me and considered in my medical decision making (see chart for details).     Patient is alert and oriented cooperative, although she is a bit of a poor historian.  It sounds like she was having low blood sugar earlier this morning and this is the second time this week.  She states this is because she has not been eating and drinking very well.  Screening evaluation today is reasonable.  She does have white blood cells in her urine, but no bacteria.  I have sent this for culture.  Apparently she is taking Keflex for urinary tract infection.  I do not think that she is  showing signs of sepsis.  She had no fevers or hypotension or tachycardia, her lactate is normal and her white blood cell count is normal.  CT head was obtained  given second time this week that she has been altered although this was reassuring and having had 2 episodes of hypoglycemia, will decrease her Lantus again.  Sounds like 85 units was her initial dose and was supposedly decreased to 70 units, but is not quite clear if the patient was actually getting a decreased dose.  In this case have asked her to go ahead and just cut it in half essentially until we can make sure she is not having morning hypoglycemic episodes.    CONSULTATIONS:   [facility]ace nurse at bedside, discussed patient's history.   Patient / Family / Caregiver informed of clinical course, medical decision-making process, and agree with plan.   I discussed return precautions, follow-up instructions, and discharge instructions with patient and/or family.  Discharge Instructions : You are evaluated for low blood sugar this morning.  Please make sure that your Lantus dose is decreased to 40 units, this is essentially half of the initial-prior dosing of 85 units, until there are any changes with your primary care doctor.  Return to the emergency department immediately for any worsening condition including confusion altered mental status, fever, chest pain, weakness, numbness, or any other symptoms concerning to you.    ___________________________________________   FINAL CLINICAL IMPRESSION(S) / ED DIAGNOSES   Final diagnoses:  Hypoglycemia      ___________________________________________         Note: This dictation was prepared with Dragon dictation. Any transcriptional errors that result from this process are unintentional    Lisa Roca, MD 04/11/18 [id number]

## 2018-04-11 NOTE — ED Notes (Signed)
CBG on arrival 29

## 2018-04-13 LAB — URINE CULTURE: Culture: >=100000 — AB

## 2018-04-14 NOTE — Progress Notes (Addendum)
ED Antimicrobial Stewardship Positive Culture Follow Up   Barbara Farley is an 72 y.o. female who presented to [facility] on 04/11/2018 with a chief complaint of  Chief Complaint  Patient presents with  . Altered Mental Status    Recent Results (from the past 720 hour(s))  Urine culture     Status: Abnormal   Collection Time: 04/11/18 12:25 PM  Result Value Ref Range Status   Specimen Description   Final    URINE, RANDOM Performed at [facility], [address] [place], [place] [address]    Special Requests   Final    NONE Performed at [facility], [address], [place], [place] [address]    Culture >=100,000 COLONIES/mL ESCHERICHIA COLI (A)  Final   Report Status 04/13/2018 FINAL  Final   Organism ID, Bacteria ESCHERICHIA COLI (A)  Final      Susceptibility   Escherichia coli - MIC*    AMPICILLIN <=2 SENSITIVE Sensitive     CEFAZOLIN <=4 SENSITIVE Sensitive     CEFTRIAXONE <=1 SENSITIVE Sensitive     CIPROFLOXACIN <=0.25 SENSITIVE Sensitive     GENTAMICIN <=1 SENSITIVE Sensitive     IMIPENEM <=0.25 SENSITIVE Sensitive     NITROFURANTOIN <=16 SENSITIVE Sensitive     TRIMETH/SULFA <=20 SENSITIVE Sensitive     AMPICILLIN/SULBACTAM <=2 SENSITIVE Sensitive     PIP/TAZO <=4 SENSITIVE Sensitive     Extended ESBL NEGATIVE Sensitive     * >=100,000 COLONIES/mL ESCHERICHIA COLI   Per ED MD note pt on keflex recently. Spoke with Dr Reita Cliche who wants to step up the therapy to cefpodoxime based on UCx results. Called pt and relayed information about UTI and need for treatment. Pt states she cannot get to a pharmacy today (e.g. [facility]) and would like the prescription to be called into [facility] on [address] (as they are closed today); pt states they provide transportation for patients and she can get her medicine that way. Also informed pt she can always come back to ED for treatment if she feels worse or if she  can't get to the clinic.   [x]  Patient discharged originally without antimicrobial agent and treatment is now indicated  New antibiotic prescription: cefpodoxime 100 mg PO BID x 7 days  ED Provider: Dr Lisa Roca  Will leave a message for the next pharmacist to call this Rx into pharmacy tomorrow when they are open.   Rocky Morel 04/14/2018, 3:29 PM Clinical Pharmacist [phone] M-F 12-8 pm

## 2018-04-15 ENCOUNTER — Other Ambulatory Visit: Payer: Self-pay | Admitting: Family Medicine

## 2018-04-15 DIAGNOSIS — I5032 Chronic diastolic (congestive) heart failure: Secondary | ICD-10-CM

## 2018-04-15 NOTE — Progress Notes (Signed)
Prescription was called into [facility].   Prescription: Cefpodoxime 100 mg BID x 7 days.

## 2018-04-23 ENCOUNTER — Ambulatory Visit
Admission: RE | Admit: 2018-04-23 | Discharge: 2018-04-23 | Disposition: A | Discharge status: Home / Self Care | Payer: Medicare (Managed Care) | Source: Ambulatory Visit | Attending: Family Medicine | Admitting: Family Medicine

## 2018-04-23 DIAGNOSIS — J449 Chronic obstructive pulmonary disease, unspecified: Secondary | ICD-10-CM | POA: Diagnosis not present

## 2018-04-23 DIAGNOSIS — I340 Nonrheumatic mitral (valve) insufficiency: Secondary | ICD-10-CM | POA: Insufficient documentation

## 2018-04-23 DIAGNOSIS — I2510 Atherosclerotic heart disease of native coronary artery without angina pectoris: Secondary | ICD-10-CM | POA: Diagnosis not present

## 2018-04-23 DIAGNOSIS — E785 Hyperlipidemia, unspecified: Secondary | ICD-10-CM | POA: Diagnosis not present

## 2018-04-23 DIAGNOSIS — I5032 Chronic diastolic (congestive) heart failure: Secondary | ICD-10-CM | POA: Diagnosis present

## 2018-04-23 DIAGNOSIS — D649 Anemia, unspecified: Secondary | ICD-10-CM | POA: Diagnosis not present

## 2018-04-23 DIAGNOSIS — E119 Type 2 diabetes mellitus without complications: Secondary | ICD-10-CM | POA: Insufficient documentation

## 2018-04-23 DIAGNOSIS — I110 Hypertensive heart disease with heart failure: Secondary | ICD-10-CM | POA: Diagnosis not present

## 2018-04-23 DIAGNOSIS — G4730 Sleep apnea, unspecified: Secondary | ICD-10-CM | POA: Diagnosis not present

## 2018-04-23 NOTE — Progress Notes (Signed)
*  PRELIMINARY RESULTS* Echocardiogram 2D Echocardiogram has been performed.  Sherrie Sport 04/23/2018, 10:55 AM

## 2018-07-10 ENCOUNTER — Other Ambulatory Visit: Payer: Self-pay

## 2018-07-10 DIAGNOSIS — N6311 Unspecified lump in the right breast, upper outer quadrant: Secondary | ICD-10-CM

## 2018-08-07 ENCOUNTER — Other Ambulatory Visit: Payer: Medicare (Managed Care)

## 2018-08-08 ENCOUNTER — Ambulatory Visit: Payer: Medicare (Managed Care) | Admitting: General Surgery

## 2018-09-12 ENCOUNTER — Ambulatory Visit: Payer: Medicare (Managed Care) | Admitting: General Surgery

## 2018-11-19 ENCOUNTER — Ambulatory Visit
Admission: RE | Admit: 2018-11-19 | Discharge: 2018-11-19 | Disposition: A | Discharge status: Home / Self Care | Payer: Medicare (Managed Care) | Source: Ambulatory Visit | Attending: General Surgery | Admitting: General Surgery

## 2018-11-19 ENCOUNTER — Other Ambulatory Visit: Payer: Self-pay

## 2018-11-19 DIAGNOSIS — N6311 Unspecified lump in the right breast, upper outer quadrant: Secondary | ICD-10-CM

## 2018-12-03 ENCOUNTER — Ambulatory Visit: Payer: Medicare (Managed Care) | Admitting: General Surgery

## 2018-12-13 ENCOUNTER — Other Ambulatory Visit: Payer: Self-pay

## 2018-12-13 ENCOUNTER — Ambulatory Visit (INDEPENDENT_AMBULATORY_CARE_PROVIDER_SITE_OTHER): Payer: Medicare (Managed Care) | Admitting: Surgery

## 2018-12-13 ENCOUNTER — Ambulatory Visit: Payer: Medicare (Managed Care) | Admitting: Surgery

## 2018-12-13 ENCOUNTER — Encounter: Payer: Self-pay | Admitting: Surgery

## 2018-12-13 ENCOUNTER — Telehealth: Payer: Self-pay

## 2018-12-13 VITALS — BP 124/72 | HR 101 | Temp 97.7°F | Resp 18 | Ht 66.0 in | Wt 218.0 lb

## 2018-12-13 DIAGNOSIS — N6310 Unspecified lump in the right breast, unspecified quadrant: Secondary | ICD-10-CM | POA: Diagnosis not present

## 2018-12-13 NOTE — Progress Notes (Signed)
12/13/2018  History of Present Illness: Barbara Farley is a 73 y.o. female presenting for evaluation of multiple masses of the right breast.  She has been followed by Dr. Bary Castilla in the past.  She's been getting serial mammograms which have shown different masses in her right breast.  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    From her mammogram results, I can gather that there are 4 different masses that have been biopsied (ribbon, coil, x, and wing), and there are two other masses that have been recommended for biopsy but have not been done.  On her most recent mammogram, on 11/19/18, two of the masses have grown in size, one which was biopsied already and one that has not been yet.  Of note, she also has a history of pulmonary nodules, stable as of her last CT chest on 12/2017, COPD, and OSA.  She has been followed by Dr. Raul Del with Pulmonology, but there is no visit in her medical records since 02/2016.  She also has a history of dCHF, HTN, PAD s/p L BKA and is followed by Dr. Saralyn Pilar with Cardiology, but also there is no visit in her medical records since 11/2015.  Patient reports that she gets short of breath and uses oxygen at night.  She does not really ambulate much and is mostly on her wheelchair, and if she does walk, she needs a walker.  During today's visit, she's very sleepy and says she did not sleep well last night.  Past Medical History: Past Medical History:  Diagnosis Date  . Anemia   . CAD (coronary artery disease)   . CHF (congestive heart failure) ([place])   . COPD (chronic obstructive pulmonary disease) ([place])   . Diabetes mellitus without complication ([place])   . Hx of BKA, left ([place]) 2008?  Marland Kitchen Hyperlipemia   . Hypertension   . Morbid obesity ([place])   . Seizures ([place])   . Sleep apnea      Past Surgical History: Past Surgical History:  Procedure Laterality Date  . BELOW KNEE LEG AMPUTATION Left 2008  . BREAST  BIOPSY Right 03/20/2017   3:00 FIBROEPITHELIAL LESION coil shape  . BREAST BIOPSY Right 03/20/2017   9:30 wing shape, fibroadenomatoid  . BREAST BIOPSY Right 02/28/2017   3:30 LIQ 2cmfn ribbon shape, FIBROEPITHELIAL LESION  . BREAST BIOPSY Right 09/12/2017   Right  LIQ, x shape marker right breast biopsy  with fibroadenomatous changes.    Home Medications: Prior to Admission medications   Medication Sig Start Date End Date Taking? Authorizing Provider  acetaminophen (TYLENOL) 325 MG tablet Take 650 mg by mouth every 6 (six) hours as needed.   Yes [provider]  amLODipine-atorvastatin (CADUET) 5-40 MG tablet Take 1 tablet by mouth daily.   Yes [provider]  aspirin EC 81 MG tablet Take 81 mg by mouth daily.   Yes [provider]  atorvastatin (LIPITOR) 40 MG tablet Take 40 mg by mouth daily.   Yes [provider]  calcium carbonate (TUMS - DOSED IN MG ELEMENTAL CALCIUM) 500 MG chewable tablet Chew 1 tablet by mouth 2 (two) times daily as needed for indigestion or heartburn.   Yes [provider]  cholecalciferol (VITAMIN D3) 25 MCG (1000 UT) tablet Take 2,000 Units by mouth daily.   Yes [provider]  docusate sodium (COLACE) 100 MG capsule Take 100 mg by mouth daily.   Yes [provider]  furosemide (LASIX) 20 MG tablet  Take 60 mg by mouth every morning.   Yes [provider]  glucagon (GLUCAGON EMERGENCY) 1 MG injection Inject 1 mg into the vein once as needed.   Yes [provider]  glucose 4 GM chewable tablet Chew 1 tablet by mouth once. Take 1 tablet by mouth for glucose less than 70, repeat every 5 mintues until blood glucose grater than 70   Yes [provider]  insulin glargine (LANTUS) 100 UNIT/ML injection Inject 70 Units into the skin daily.   Yes [provider]  isosorbide mononitrate (IMDUR) 60 MG 24 hr tablet Take 60 mg by mouth daily.    Yes [provider]   lisinopril (PRINIVIL,ZESTRIL) 40 MG tablet Take 40 mg by mouth daily.    Yes [provider]  nitroGLYCERIN (NITROSTAT) 0.4 MG SL tablet Place 0.4 mg under the tongue every 5 (five) minutes as needed for chest pain.   Yes [provider]  oxybutynin (DITROPAN-XL) 10 MG 24 hr tablet Take 10 mg by mouth daily.   Yes [provider]  PARoxetine (PAXIL) 20 MG tablet Take 20 mg by mouth daily.   Yes [provider]  polyethylene glycol powder (GLYCOLAX/MIRALAX) powder Take 17 g by mouth daily.   Yes [provider]  ranitidine (ZANTAC) 150 MG tablet Take 150 mg by mouth 2 (two) times daily.   Yes [provider]  senna (SENOKOT) 8.6 MG TABS tablet Take 2 tablets by mouth at bedtime.   Yes [provider]  tiotropium (SPIRIVA) 18 MCG inhalation capsule Place 18 mcg into inhaler and inhale daily.   Yes [provider]  Vitamin D, Ergocalciferol, (DRISDOL) 50000 units CAPS capsule Take 50,000 Units by mouth every 30 (thirty) days. Take on the first Monday of each month   Yes [provider]    Allergies: No Known Allergies  Review of Systems: Review of Systems  Constitutional: Negative for chills and fever.  Respiratory: Positive for shortness of breath.   Cardiovascular: Negative for chest pain.  Gastrointestinal: Negative for nausea and vomiting.    Physical Exam BP 124/72   Pulse (!) 101   Temp 97.7 F (36.5 C) (Temporal)   Resp 18   Ht 5\' 6"  (1.676 m)   Wt 218 lb (98.9 kg)   SpO2 92%   BMI 35.19 kg/m  CONSTITUTIONAL: No acute distress but appears tired and sleepy. HEENT:  Normocephalic, atraumatic, extraocular motion intact. RESPIRATORY:  Normal respiratory effort without pathologic use of accessory muscles. CARDIOVASCULAR: Heart is regular without murmurs, gallops, or rubs. BREAST:  Patient has very large breasts with bilateral ptosis.  Right breast with multiple palpable masses.  I'm able to palpate  mass at 3:30 position 3 cm from nipple, also the 1 o clock mass 10 cm from nipple, the 8:30 mass that is 9 cm from nipple.  All are somewhat mobile. No nipple inversion, drainage, or skin changes.  There is no lymphadenopathy in the right axilla. GI: The abdomen is soft, obese, non-distended non-tender. NEUROLOGIC:  Motor and sensation is grossly normal.  Cranial nerves are grossly intact. PSYCH:  Alert and oriented to person, place and time. Affect is normal.  Labs/Imaging: Mammogram and U/S 11/19/18: FINDINGS: Lobulated oval mass in the lower slightly inner right breast retroareolar has increased in size mammographically and corresponds to the biopsy-proven fibroepithelial lesion, containing ribbon shaped biopsy clip. On mammography, this mass measures up to 3.2 cm in the CC projection. This is increased from approximately 2.9 cm  on the prior mammogram of Sep 12, 2017.  Similarly, a rounded circumscribed mass in the upper inner right breast, mid to posterior thirds has increased in size mammographically, measuring up to 1.6 cm in the CC projection, previously 1.2 cm on the mammogram of May 2019.  Low-density mass in the outer right breast posteriorly appears mammographically stable.  The biopsied mass in the lower inner right breast containing coil shaped biopsy clip and shown to be fibroepithelial lesion is mammographically stable.  X shaped biopsy clip in the central/slightly outer right breast at site of prior stereotactic biopsy noted.  No suspicious microcalcifications or architectural distortion in the right breast.  Atherosclerotic calcifications noted.  No suspicious findings are identified in the left breast. Atherosclerotic vascular calcifications in the left breast.  Mammographic images were processed with CAD.  On physical exam, there is a palpable approximately 3 cm mass in the 3:30 position the right breast 3 cm from the nipple. I palpate approximately once 0.5 cm  mass in the 1 o'clock position the right breast 10 cm from nipple. I do not palpate a mass in the 8:30 position of the right breast 9 cm from the nipple.  Targeted ultrasound is performed, showing:  RIGHT breast:  3:30 position 3 cm from nipple is a heterogeneously hypoechoic lobulated circumscribed mass measuring 3.4 x 2.0 x 2.6 cm. This corresponds to the biopsy-proven fibroepithelial lesion. On the most recent prior ultrasound of August 21, 2017 this mass measured 2.3 x 1.8 x 1.9 cm. Definite interval growth.  At 1 o'clock position 10 cm from nipple is a rounded circumscribed heterogeneously hypoechoic mass with internal echogenicities that measures 1.7 x 1.6 x 1.7 cm. Previously in 2019 this mass measured 1.2 x 1.2 x 1.1 cm. Definite interval growth.  At 8:30 position 9 cm from nipple is an oval cystic septations. The mass is circumscribed and measures 1.6 x 0.6 x 0.9 cm. No internal vascular flow is identified. This mass is without significant change, previously measuring 1.4 x 0.6 x 1.0 cm.  Ultrasound the right axilla is negative for lymphadenopathy.  IMPRESSION: Interval enlargement of the biopsy-proven fibroepithelial lesion in the 3:30 position of the right breast, palpable with ribbon shaped biopsy clip present.  Interval enlargement of a rounded hypoechoic mass in the 1 o'clock position of the left breast 10 cm from nipple, palpable. This mass has not been biopsied in the past. Considerations include fibroepithelial lesion, malignancy, or nodal metastasis of the fibroepithelial lesion in the retroareolar right breast to an intramammary lymph node.  Stable size of indeterminate right breast mass at 8:30 position, previously recommended for ultrasound-guided core needle biopsy. This mass has not been biopsied and is favored to be a mildly complicated cyst.  No evidence of malignancy in the left breast.  RECOMMENDATION: Surgical consultation follow-up with Dr. Bary Castilla to  discuss findings on today's report and discussed management of the enlarging right breast masses.  Additionally, annual bilateral mammography is recommended, or sooner as needed.   Assessment and Plan: This is a 73 y.o. female with multiple right breast masses.  The patient wishes to have surgery to remove the masses.  Discussed with her the role of lumpectomies vs mastectomy given the multiple masses.  However, the patient has many comorbidities that make any surgery difficult.  She has not followed up with her Cardiologist or her Pulmonologist in years.  She's a vasculopath, with chronic kidney disease among multiple.  Her biopsies have not revealed any malignancy, but she has  two masses that are growing compared to her prior mammogram.  In the meantime, will send clearance forms to her PCP, pulmonologist, and cardiologist.  She will need to be optimized prior to any surgical procedure.  However, she may end up benefiting from referral to a tertiary center for surgical management.  Patient will follow up in 2 weeks for further discussion of her mammogram results and surgical plans vs referral.  Face-to-face time spent with the patient and care providers was 25 minutes, with more than 50% of the time spent counseling, educating, and coordinating care of the patient.     Melvyn Neth, [facility]

## 2018-12-13 NOTE — Patient Instructions (Addendum)
  Please see your follow up appointment listed below. We will send medical, cardiac and pulmonary clearances to your physicians to obtain clearance prior to any surgery being scheduled.

## 2018-12-13 NOTE — Telephone Encounter (Signed)
Medical Clearance faxed to Dr.Mary Mouw@ [phone] Cardiac Clearance- Dr.Paraschos- [phone] Pulmonary Clearance-Dr.[phone] [place] clearances faxed.

## 2018-12-18 ENCOUNTER — Telehealth: Payer: Self-pay

## 2018-12-18 NOTE — Telephone Encounter (Signed)
Spoke with [facility] -Cardiology has tried reaching patient to schedule an appointment [place] office has tried reaching patient on 2 attempts- patinet has not responded.

## 2018-12-18 NOTE — Telephone Encounter (Signed)
Spoke with Dr.Piscoya and surgery cancelled for this patient- no clearances needed. PCP to imitate referral to [facility].

## 2018-12-19 ENCOUNTER — Telehealth: Payer: Self-pay | Admitting: *Deleted

## 2018-12-19 NOTE — Telephone Encounter (Signed)
Barbara Farley from [place] office called to let us know that they have been trying to get in touch with the patient to get her to come in for medical clearance but the numbers having been discounted, I gave her PACES number where she is currently living per our system.

## 2018-12-27 ENCOUNTER — Ambulatory Visit: Payer: Self-pay | Admitting: Surgery

## 2019-01-14 ENCOUNTER — Emergency Department: Payer: Medicare (Managed Care)

## 2019-01-14 ENCOUNTER — Other Ambulatory Visit: Payer: Self-pay

## 2019-01-14 ENCOUNTER — Inpatient Hospital Stay
Admission: EM | Admit: 2019-01-14 | Discharge: 2019-01-16 | DRG: 291 | Disposition: A | Discharge status: Home Health | Payer: Medicare (Managed Care) | Attending: Internal Medicine | Admitting: Internal Medicine

## 2019-01-14 ENCOUNTER — Encounter: Payer: Self-pay | Admitting: Emergency Medicine

## 2019-01-14 DIAGNOSIS — Z89512 Acquired absence of left leg below knee: Secondary | ICD-10-CM | POA: Diagnosis not present

## 2019-01-14 DIAGNOSIS — Z66 Do not resuscitate: Secondary | ICD-10-CM | POA: Diagnosis present

## 2019-01-14 DIAGNOSIS — I248 Other forms of acute ischemic heart disease: Secondary | ICD-10-CM | POA: Diagnosis present

## 2019-01-14 DIAGNOSIS — N183 Chronic kidney disease, stage 3 (moderate): Secondary | ICD-10-CM | POA: Diagnosis present

## 2019-01-14 DIAGNOSIS — Z87891 Personal history of nicotine dependence: Secondary | ICD-10-CM | POA: Diagnosis not present

## 2019-01-14 DIAGNOSIS — Z7982 Long term (current) use of aspirin: Secondary | ICD-10-CM

## 2019-01-14 DIAGNOSIS — E11649 Type 2 diabetes mellitus with hypoglycemia without coma: Secondary | ICD-10-CM | POA: Diagnosis present

## 2019-01-14 DIAGNOSIS — G4730 Sleep apnea, unspecified: Secondary | ICD-10-CM | POA: Diagnosis present

## 2019-01-14 DIAGNOSIS — Z833 Family history of diabetes mellitus: Secondary | ICD-10-CM

## 2019-01-14 DIAGNOSIS — I5033 Acute on chronic diastolic (congestive) heart failure: Secondary | ICD-10-CM | POA: Diagnosis present

## 2019-01-14 DIAGNOSIS — E785 Hyperlipidemia, unspecified: Secondary | ICD-10-CM | POA: Diagnosis present

## 2019-01-14 DIAGNOSIS — Z9111 Patient's noncompliance with dietary regimen: Secondary | ICD-10-CM | POA: Diagnosis not present

## 2019-01-14 DIAGNOSIS — E1122 Type 2 diabetes mellitus with diabetic chronic kidney disease: Secondary | ICD-10-CM | POA: Diagnosis present

## 2019-01-14 DIAGNOSIS — I130 Hypertensive heart and chronic kidney disease with heart failure and stage 1 through stage 4 chronic kidney disease, or unspecified chronic kidney disease: Principal | ICD-10-CM | POA: Diagnosis present

## 2019-01-14 DIAGNOSIS — Z6841 Body Mass Index (BMI) 40.0 and over, adult: Secondary | ICD-10-CM

## 2019-01-14 DIAGNOSIS — I2510 Atherosclerotic heart disease of native coronary artery without angina pectoris: Secondary | ICD-10-CM | POA: Diagnosis present

## 2019-01-14 DIAGNOSIS — Z794 Long term (current) use of insulin: Secondary | ICD-10-CM

## 2019-01-14 DIAGNOSIS — Z79899 Other long term (current) drug therapy: Secondary | ICD-10-CM

## 2019-01-14 DIAGNOSIS — J449 Chronic obstructive pulmonary disease, unspecified: Secondary | ICD-10-CM | POA: Diagnosis present

## 2019-01-14 DIAGNOSIS — R0602 Shortness of breath: Secondary | ICD-10-CM | POA: Diagnosis not present

## 2019-01-14 DIAGNOSIS — Z20828 Contact with and (suspected) exposure to other viral communicable diseases: Secondary | ICD-10-CM | POA: Diagnosis present

## 2019-01-14 DIAGNOSIS — J9601 Acute respiratory failure with hypoxia: Secondary | ICD-10-CM | POA: Diagnosis present

## 2019-01-14 LAB — CBC WITH DIFFERENTIAL/PLATELET
Abs Immature Granulocytes: 0.06 10*3/uL (ref 0.00–0.07)
Basophils Absolute: 0 10*3/uL (ref 0.0–0.1)
Basophils Relative: 0 %
Eosinophils Absolute: 0 10*3/uL (ref 0.0–0.5)
Eosinophils Relative: 0 %
HCT: 44.8 % (ref 36.0–46.0)
Hemoglobin: 13.5 g/dL (ref 12.0–15.0)
Immature Granulocytes: 1 %
Lymphocytes Relative: 5 %
Lymphs Abs: 0.4 10*3/uL — ABNORMAL LOW (ref 0.7–4.0)
MCH: 27.9 pg (ref 26.0–34.0)
MCHC: 30.1 g/dL (ref 30.0–36.0)
MCV: 92.6 fL (ref 80.0–100.0)
Monocytes Absolute: 0.1 10*3/uL (ref 0.1–1.0)
Monocytes Relative: 1 %
Neutro Abs: 7.6 10*3/uL (ref 1.7–7.7)
Neutrophils Relative %: 93 %
Platelets: 186 10*3/uL (ref 150–400)
RBC: 4.84 MIL/uL (ref 3.87–5.11)
RDW: 13.1 % (ref 11.5–15.5)
WBC: 8.2 10*3/uL (ref 4.0–10.5)
nRBC: 0 % (ref 0.0–0.2)

## 2019-01-14 LAB — CREATININE, SERUM
Creatinine, Ser: 1.61 mg/dL — ABNORMAL HIGH (ref 0.44–1.00)
GFR calc Af Amer: 37 mL/min — ABNORMAL LOW (ref >60)
GFR calc non Af Amer: 32 mL/min — ABNORMAL LOW (ref >60)

## 2019-01-14 LAB — COMPREHENSIVE METABOLIC PANEL
ALT: 14 U/L (ref 0–44)
AST: 18 U/L (ref 15–41)
Albumin: 3.4 g/dL — ABNORMAL LOW (ref 3.5–5.0)
Alkaline Phosphatase: 82 U/L (ref 38–126)
Anion gap: 10 (ref 5–15)
BUN: 16 mg/dL (ref 8–23)
CO2: 26 mmol/L (ref 22–32)
Calcium: 8.7 mg/dL — ABNORMAL LOW (ref 8.9–10.3)
Chloride: 99 mmol/L (ref 98–111)
Creatinine, Ser: 1.67 mg/dL — ABNORMAL HIGH (ref 0.44–1.00)
GFR calc Af Amer: 35 mL/min — ABNORMAL LOW (ref >60)
GFR calc non Af Amer: 30 mL/min — ABNORMAL LOW (ref >60)
Glucose, Bld: 181 mg/dL — ABNORMAL HIGH (ref 70–99)
Potassium: 4.5 mmol/L (ref 3.5–5.1)
Sodium: 135 mmol/L (ref 135–145)
Total Bilirubin: 1.1 mg/dL (ref 0.3–1.2)
Total Protein: 6.4 g/dL — ABNORMAL LOW (ref 6.5–8.1)

## 2019-01-14 LAB — SARS CORONAVIRUS 2 BY RT PCR (HOSPITAL ORDER, PERFORMED IN ~~LOC~~ HOSPITAL LAB): SARS Coronavirus 2: NEGATIVE

## 2019-01-14 LAB — BRAIN NATRIURETIC PEPTIDE: B Natriuretic Peptide: 216 pg/mL — ABNORMAL HIGH (ref 0.0–100.0)

## 2019-01-14 LAB — GLUCOSE, CAPILLARY: Glucose-Capillary: 283 mg/dL — ABNORMAL HIGH (ref 70–99)

## 2019-01-14 LAB — TROPONIN I (HIGH SENSITIVITY): Troponin I (High Sensitivity): 120 ng/L (ref <18)

## 2019-01-14 MED ORDER — ONDANSETRON HCL 4 MG/2ML IJ SOLN: 4.0000 mg | Freq: Four times a day (QID) | INTRAMUSCULAR | Status: DC | PRN

## 2019-01-14 MED ORDER — INSULIN ASPART 100 UNIT/ML ~~LOC~~ SOLN
0.0000 [IU] | Freq: Three times a day (TID) | SUBCUTANEOUS | Status: DC
  Administered 2019-01-15 (×2): 7 [IU] via SUBCUTANEOUS
  Administered 2019-01-15: 5 [IU] via SUBCUTANEOUS
  Filled 2019-01-14 (×3): qty 1

## 2019-01-14 MED ORDER — SODIUM CHLORIDE 0.9% FLUSH
3.0000 mL | Freq: Two times a day (BID) | INTRAVENOUS | Status: DC
  Administered 2019-01-14 – 2019-01-16 (×4): 3 mL via INTRAVENOUS

## 2019-01-14 MED ORDER — FUROSEMIDE 10 MG/ML IJ SOLN
40.0000 mg | Freq: Once | INTRAMUSCULAR | Status: AC
  Administered 2019-01-14: 40 mg via INTRAVENOUS
  Filled 2019-01-14: qty 4

## 2019-01-14 MED ORDER — VITAMIN D 25 MCG (1000 UNIT) PO TABS
1000.0000 [IU] | ORAL_TABLET | Freq: Every day | ORAL | Status: DC
  Administered 2019-01-15 – 2019-01-16 (×2): 1000 [IU] via ORAL
  Filled 2019-01-14 (×2): qty 1

## 2019-01-14 MED ORDER — ASPIRIN EC 81 MG PO TBEC
81.0000 mg | DELAYED_RELEASE_TABLET | Freq: Every day | ORAL | Status: DC
  Administered 2019-01-15 – 2019-01-16 (×2): 81 mg via ORAL
  Filled 2019-01-14 (×2): qty 1

## 2019-01-14 MED ORDER — SPIRONOLACTONE 25 MG PO TABS
25.0000 mg | ORAL_TABLET | Freq: Every day | ORAL | Status: DC
  Administered 2019-01-15 – 2019-01-16 (×2): 25 mg via ORAL
  Filled 2019-01-14 (×2): qty 1

## 2019-01-14 MED ORDER — TIOTROPIUM BROMIDE MONOHYDRATE 18 MCG IN CAPS
18.0000 ug | ORAL_CAPSULE | Freq: Every day | RESPIRATORY_TRACT | Status: DC
  Administered 2019-01-15 – 2019-01-16 (×2): 18 ug via RESPIRATORY_TRACT
  Filled 2019-01-14: qty 5

## 2019-01-14 MED ORDER — LISINOPRIL 20 MG PO TABS
40.0000 mg | ORAL_TABLET | Freq: Every day | ORAL | Status: DC
  Administered 2019-01-15 – 2019-01-16 (×2): 40 mg via ORAL
  Filled 2019-01-14 (×2): qty 2

## 2019-01-14 MED ORDER — POLYETHYLENE GLYCOL 3350 17 GM/SCOOP PO POWD
17.0000 g | Freq: Every day | ORAL | Status: DC
  Administered 2019-01-15: 17 g via ORAL
  Filled 2019-01-14: qty 255

## 2019-01-14 MED ORDER — ISOSORBIDE MONONITRATE ER 60 MG PO TB24
60.0000 mg | ORAL_TABLET | Freq: Every day | ORAL | Status: DC
  Administered 2019-01-15 – 2019-01-16 (×2): 60 mg via ORAL
  Filled 2019-01-14 (×2): qty 1

## 2019-01-14 MED ORDER — ACETAMINOPHEN 325 MG PO TABS: 650.0000 mg | ORAL_TABLET | ORAL | Status: DC | PRN

## 2019-01-14 MED ORDER — SODIUM CHLORIDE 0.9% FLUSH: 3.0000 mL | INTRAVENOUS | Status: DC | PRN

## 2019-01-14 MED ORDER — IPRATROPIUM-ALBUTEROL 0.5-2.5 (3) MG/3ML IN SOLN
3.0000 mL | Freq: Once | RESPIRATORY_TRACT | Status: AC
  Administered 2019-01-14: 3 mL via RESPIRATORY_TRACT
  Filled 2019-01-14: qty 3

## 2019-01-14 MED ORDER — INSULIN ASPART 100 UNIT/ML ~~LOC~~ SOLN
0.0000 [IU] | Freq: Every day | SUBCUTANEOUS | Status: DC
  Administered 2019-01-14: 3 [IU] via SUBCUTANEOUS
  Administered 2019-01-15: 21:00:00 4 [IU] via SUBCUTANEOUS
  Filled 2019-01-14 (×2): qty 1

## 2019-01-14 MED ORDER — ATORVASTATIN CALCIUM 20 MG PO TABS
40.0000 mg | ORAL_TABLET | Freq: Every day | ORAL | Status: DC
  Administered 2019-01-15 – 2019-01-16 (×2): 40 mg via ORAL
  Filled 2019-01-14 (×2): qty 2

## 2019-01-14 MED ORDER — NITROGLYCERIN 2 % TD OINT
1.0000 [in_us] | TOPICAL_OINTMENT | Freq: Three times a day (TID) | TRANSDERMAL | Status: DC
  Administered 2019-01-14 – 2019-01-16 (×6): 1 [in_us] via TOPICAL
  Filled 2019-01-14 (×5): qty 1

## 2019-01-14 MED ORDER — HEPARIN SODIUM (PORCINE) 5000 UNIT/ML IJ SOLN
5000.0000 [IU] | Freq: Three times a day (TID) | INTRAMUSCULAR | Status: DC
  Administered 2019-01-14 – 2019-01-16 (×5): 5000 [IU] via SUBCUTANEOUS
  Filled 2019-01-14 (×5): qty 1

## 2019-01-14 MED ORDER — FUROSEMIDE 10 MG/ML IJ SOLN
20.0000 mg | Freq: Three times a day (TID) | INTRAMUSCULAR | Status: DC
  Administered 2019-01-14 – 2019-01-16 (×5): 20 mg via INTRAVENOUS
  Filled 2019-01-14 (×5): qty 2

## 2019-01-14 MED ORDER — SENNA 8.6 MG PO TABS
2.0000 | ORAL_TABLET | Freq: Two times a day (BID) | ORAL | Status: DC
  Administered 2019-01-14 – 2019-01-15 (×2): 17.2 mg via ORAL
  Filled 2019-01-14 (×3): qty 2

## 2019-01-14 MED ORDER — PNEUMOCOCCAL VAC POLYVALENT 25 MCG/0.5ML IJ INJ: 0.5000 mL | INJECTION | INTRAMUSCULAR | Status: DC

## 2019-01-14 MED ORDER — INFLUENZA VAC A&B SA ADJ QUAD 0.5 ML IM PRSY
0.5000 mL | PREFILLED_SYRINGE | INTRAMUSCULAR | Status: DC
  Filled 2019-01-14: qty 0.5

## 2019-01-14 MED ORDER — SODIUM CHLORIDE 0.9 % IV SOLN: 250.0000 mL | INTRAVENOUS | Status: DC | PRN

## 2019-01-14 MED ORDER — CALCIUM CARBONATE ANTACID 500 MG PO CHEW: 1.0000 | CHEWABLE_TABLET | Freq: Two times a day (BID) | ORAL | Status: DC | PRN

## 2019-01-14 MED ORDER — SIMETHICONE 80 MG PO CHEW
80.0000 mg | CHEWABLE_TABLET | Freq: Three times a day (TID) | ORAL | Status: DC | PRN
  Filled 2019-01-14: qty 1

## 2019-01-14 MED ORDER — INSULIN GLARGINE 100 UNIT/ML ~~LOC~~ SOLN
26.0000 [IU] | Freq: Every day | SUBCUTANEOUS | Status: DC
  Administered 2019-01-14 – 2019-01-15 (×2): 26 [IU] via SUBCUTANEOUS
  Filled 2019-01-14 (×2): qty 0.26

## 2019-01-14 MED ORDER — METHYLPREDNISOLONE SODIUM SUCC 125 MG IJ SOLR
125.0000 mg | Freq: Once | INTRAMUSCULAR | Status: AC
  Administered 2019-01-14: 14:00:00 125 mg via INTRAVENOUS
  Filled 2019-01-14: qty 2

## 2019-01-14 NOTE — ED Provider Notes (Signed)
[facility] Emergency Department Provider Note   ____________________________________________    I have reviewed the triage vital signs and the nursing notes.   HISTORY  Chief Complaint Weakness and Shortness of Breath     HPI Barbara Farley is a 73 y.o. female who presents with complaints of weakness and shortness of breath.  Patient report symptoms have been worsening over the last 3 days.  She describes significant shortness of breath.  Also complains of weakness, did not get out of bed today per [place] providers.  No reports of fevers or chills.  Mild cough occasionally.  Does have a history of COPD and diabetes as well.   Past Medical History:  Diagnosis Date  . Anemia   . CAD (coronary artery disease)   . CHF (congestive heart failure) ([place])   . COPD (chronic obstructive pulmonary disease) ([place])   . Diabetes mellitus without complication ([place])   . Hx of BKA, left ([place]) 2008?  Marland Kitchen Hyperlipemia   . Hypertension   . Morbid obesity ([place])   . Seizures ([place])   . Sleep apnea     Patient Active Problem List   Diagnosis Date Noted  . Mass of upper outer quadrant of right breast 05/18/2017  . Chest pain with low risk for cardiac etiology 07/30/2015  . SOB (shortness of breath) on exertion 07/30/2015  . Multiple pulmonary nodules 07/21/2015  . CHF (congestive heart failure) ([place]) 06/18/2015    Past Surgical History:  Procedure Laterality Date  . BELOW KNEE LEG AMPUTATION Left 2008  . BREAST BIOPSY Right 03/20/2017   3:00 FIBROEPITHELIAL LESION coil shape  . BREAST BIOPSY Right 03/20/2017   9:30 wing shape, fibroadenomatoid  . BREAST BIOPSY Right 02/28/2017   3:30 LIQ 2cmfn ribbon shape, FIBROEPITHELIAL LESION  . BREAST BIOPSY Right 09/12/2017   Right  LIQ, x shape marker right breast biopsy  with fibroadenomatous changes.    Prior to Admission medications   Medication Sig Start Date End Date Taking? Authorizing Provider  acetaminophen  (TYLENOL) 325 MG tablet Take 650 mg by mouth every 6 (six) hours as needed.    [provider]  amLODipine-atorvastatin (CADUET) 5-40 MG tablet Take 1 tablet by mouth daily.    [provider]  aspirin EC 81 MG tablet Take 81 mg by mouth daily.    [provider]  atorvastatin (LIPITOR) 40 MG tablet Take 40 mg by mouth daily.    [provider]  calcium carbonate (TUMS - DOSED IN MG ELEMENTAL CALCIUM) 500 MG chewable tablet Chew 1 tablet by mouth 2 (two) times daily as needed for indigestion or heartburn.    [provider]  cholecalciferol (VITAMIN D3) 25 MCG (1000 UT) tablet Take 2,000 Units by mouth daily.    [provider]  docusate sodium (COLACE) 100 MG capsule Take 100 mg by mouth daily.    [provider]  furosemide (LASIX) 20 MG tablet Take 60 mg by mouth every morning.    [provider]  glucagon (GLUCAGON EMERGENCY) 1 MG injection Inject 1 mg into the vein once as needed.    [provider]  glucose 4 GM chewable tablet Chew 1 tablet by mouth once. Take 1 tablet by mouth for glucose less than 70, repeat every 5 mintues until blood glucose grater than 70    [provider]  insulin glargine (LANTUS) 100 UNIT/ML injection Inject 70 Units into the skin daily.    [provider]  isosorbide mononitrate (IMDUR) 60 MG 24 hr tablet Take 60 mg by mouth daily.     [provider]  lisinopril (PRINIVIL,ZESTRIL) 40 MG tablet Take 40 mg by mouth daily.     [provider]  nitroGLYCERIN (NITROSTAT) 0.4 MG SL tablet Place 0.4 mg under the tongue every 5 (five) minutes as needed for chest pain.    [provider]  oxybutynin (DITROPAN-XL) 10 MG 24 hr tablet Take 10 mg by mouth daily.    [provider]  PARoxetine (PAXIL) 20 MG tablet Take 20 mg by mouth daily.    [provider]  polyethylene glycol powder (GLYCOLAX/MIRALAX) powder Take 17 g by mouth daily.     [provider]  ranitidine (ZANTAC) 150 MG tablet Take 150 mg by mouth 2 (two) times daily.    [provider]  senna (SENOKOT) 8.6 MG TABS tablet Take 2 tablets by mouth at bedtime.    [provider]  tiotropium (SPIRIVA) 18 MCG inhalation capsule Place 18 mcg into inhaler and inhale daily.    [provider]  Vitamin D, Ergocalciferol, (DRISDOL) 50000 units CAPS capsule Take 50,000 Units by mouth every 30 (thirty) days. Take on the first Monday of each month    [provider]     Allergies Patient has no known allergies.  Family History  Problem Relation Age of Onset  . Diabetes Other   . Breast cancer Mother        in early 73's    Social History Social History   Tobacco Use  . Smoking status: Former Smoker    Types: Cigarettes  . Smokeless tobacco: Never Used  Substance Use Topics  . Alcohol use: No    Alcohol/week: 0.0 standard drinks  . Drug use: No    Review of Systems  Constitutional: No fever/chills Eyes: No visual changes.  ENT: No sore throat. Cardiovascular: Denies chest pain. Respiratory:as above Gastrointestinal: No abdominal pain.   Genitourinary: Negative for dysuria. Musculoskeletal: Negative for back pain. Skin: Negative for rash. Neurological: Negative for headaches    ____________________________________________   PHYSICAL EXAM:  VITAL SIGNS: ED Triage Vitals  Enc Vitals Group     BP 01/14/19 1300 (!) 147/95     Pulse Rate 01/14/19 1300 (!) 101     Resp 01/14/19 1300 20     Temp 01/14/19 1310 98.3 F (36.8 C)     Temp Source 01/14/19 1310 Oral     SpO2 01/14/19 1300 97 %     Weight 01/14/19 1304 121 kg (266 lb 12.1 oz)     Height 01/14/19 1304 1.676 m (5\' 6" )     Head Circumference --      Peak Flow --      Pain Score 01/14/19 1304 0     Pain Loc --      Pain Edu? --      Excl. in [place]? --     Constitutional: Alert and oriented.  Eyes: Conjunctivae are normal.   Nose: No  congestion/rhinnorhea. Mouth/Throat: Mucous membranes are moist.    Cardiovascular: Normal rate, regular rhythm.   Good peripheral circulation. Respiratory: Tachypnea with increased respiratory effort no retractions.  Bibasilar Rales Gastrointestinal: Soft and nontender. No distention.    Musculoskeletal:  Warm and well perfused Neurologic:  Normal speech and language. No gross focal neurologic deficits are appreciated.  Skin:  Skin is warm, dry and intact. No rash noted. Psychiatric: Mood and affect are normal. Speech and behavior are  normal.  ____________________________________________   LABS (all labs ordered are listed, but only abnormal results are displayed)  Labs Reviewed  COMPREHENSIVE METABOLIC PANEL - Abnormal; Notable for the following components:      Result Value   Glucose, Bld 181 (*)    Creatinine, Ser 1.67 (*)    Calcium 8.7 (*)    Total Protein 6.4 (*)    Albumin 3.4 (*)    GFR calc non Af Amer 30 (*)    GFR calc Af Amer 35 (*)    All other components within normal limits  TROPONIN I (HIGH SENSITIVITY) - Abnormal; Notable for the following components:   Troponin I (High Sensitivity) 120 (*)    All other components within normal limits  SARS CORONAVIRUS 2 (HOSPITAL ORDER, [place])  CBC WITH DIFFERENTIAL/PLATELET  BRAIN NATRIURETIC PEPTIDE  CBC WITH DIFFERENTIAL/PLATELET   ____________________________________________  EKG  ED ECG REPORT I, Lavonia Drafts, the attending physician, personally viewed and interpreted this ECG.  Date: 01/14/2019  Rhythm: normal sinus rhythm QRS Axis: normal Intervals: normal ST/T Wave abnormalities: normal Narrative Interpretation: no evidence of acute ischemia  ____________________________________________  RADIOLOGY  Pulmonary edema on chest x-ray ____________________________________________   PROCEDURES  Procedure(s) performed: No  Procedures   Critical Care performed: No  ____________________________________________   INITIAL IMPRESSION / ASSESSMENT AND PLAN / ED COURSE  Pertinent labs & imaging results that were available during my care of the patient were reviewed by me and considered in my medical decision making (see chart for details).  Patient presents with shortness of breath, weakness, overall reassuring vital signs.  Obtain chest x-ray, labs.  Suspect CHF  Patient chest x-ray concerns pulmonary edema, pending COVID swab.  Will give IV Lasix, DuoNeb as well for wheezing, patient will require admission    ____________________________________________   FINAL CLINICAL IMPRESSION(S) / ED DIAGNOSES  Final diagnoses:  SOB (shortness of breath)        Note:  This document was prepared using Dragon voice recognition software and may include unintentional dictation errors.   Lavonia Drafts, MD 01/14/19 [phone]

## 2019-01-14 NOTE — ED Notes (Signed)
Attempted to call report at this time. Floor states RN in a rapid and will have to call back for report

## 2019-01-14 NOTE — ED Notes (Signed)
Patient is difficult stick; lab notified to recollect lavender top.

## 2019-01-14 NOTE — ED Notes (Signed)
Per lab, one unsuccessful stick; patient is refusing to be stuck anymore.  EDP, Kinner, made aware.

## 2019-01-14 NOTE — Progress Notes (Signed)
Patient admitted to 2A 247.  No complaints at this time. Oriented to floor and surroundings.

## 2019-01-14 NOTE — Progress Notes (Signed)
Family Meeting Note  Advance Directive:yes  patient has out of facility DNR form  comes to the emergency room with increasing shortness of breath and leg swelling. She is being admitted with congestive heart failure acute on chronic diastolic with hypoxic respiratory failure in the setting of COPD. Discuss code status patient is DNR this was confirmed with her. She carries out of facility DNR form. Time spent 16 minutes Fritzi Mandes, MD

## 2019-01-14 NOTE — ED Notes (Signed)
Report called to stephanie rn floor nurse

## 2019-01-14 NOTE — ED Notes (Signed)
Lab to bedside

## 2019-01-14 NOTE — H&P (Signed)
[facility] NAME: Barbara Farley    MR#:  [id number]  DATE OF BIRTH:  1945/11/03  DATE OF ADMISSION:  01/14/2019  PRIMARY CARE PHYSICIAN: [facility] [place]   REQUESTING/REFERRING PHYSICIAN: Dr Corky Downs  CHIEF COMPLAINT:   Increasing shortness of breath and low oxygen saturations with leg edema for few days HISTORY OF PRESENT ILLNESS:  Barbara Farley  is a 73 y.o. female with a known history of morbid obesity, sleep apnea, CHF chronic diastolic, COPD, diabetes comes to the emergency room with increasing shortness of breath and leg edema. She was seen by her pace physician found to have saturations at 60%. She was placed on 2 L nasal cannula came to the emergency room found to be hypotensive with blood pressure in the 123456 systolic over diastolic 123XX123. She was found to be in congestive heart failure acute on chronic diastolic with malignant hypertension. She received IV Lasix 40 mg in the emergency room. Patient to be started on Nitro paste. Denies any chest pain. She is currently getting breathing treatment  Pt is being admitted for acute hypoxic respiratory failure secondary to acute on chronic congestive heart failure diastolic along with malignant hypertension.  PAST MEDICAL HISTORY:   Past Medical History:  Diagnosis Date  . Anemia   . CAD (coronary artery disease)   . CHF (congestive heart failure) ([place])   . COPD (chronic obstructive pulmonary disease) ([place])   . Diabetes mellitus without complication ([place])   . Hx of BKA, left ([place]) 2008?  Marland Kitchen Hyperlipemia   . Hypertension   . Morbid obesity ([place])   . Seizures ([place])   . Sleep apnea     PAST SURGICAL HISTOIRY:   Past Surgical History:  Procedure Laterality Date  . BELOW KNEE LEG AMPUTATION Left 2008  . BREAST BIOPSY Right 03/20/2017   3:00 FIBROEPITHELIAL LESION coil shape  . BREAST BIOPSY Right 03/20/2017   9:30 wing shape, fibroadenomatoid  .  BREAST BIOPSY Right 02/28/2017   3:30 LIQ 2cmfn ribbon shape, FIBROEPITHELIAL LESION  . BREAST BIOPSY Right 09/12/2017   Right  LIQ, x shape marker right breast biopsy  with fibroadenomatous changes.    SOCIAL HISTORY:   Social History   Tobacco Use  . Smoking status: Former Smoker    Types: Cigarettes  . Smokeless tobacco: Never Used  Substance Use Topics  . Alcohol use: No    Alcohol/week: 0.0 standard drinks    FAMILY HISTORY:   Family History  Problem Relation Age of Onset  . Diabetes Other   . Breast cancer Mother        in early 70's    DRUG ALLERGIES:  No Known Allergies  REVIEW OF SYSTEMS:  Review of Systems  Constitutional: Negative for chills, fever and weight loss.  HENT: Negative for ear discharge, ear pain and nosebleeds.   Eyes: Negative for blurred vision, pain and discharge.  Respiratory: Positive for shortness of breath. Negative for sputum production, wheezing and stridor.   Cardiovascular: Positive for leg swelling and PND. Negative for chest pain, palpitations and orthopnea.  Gastrointestinal: Negative for abdominal pain, diarrhea, nausea and vomiting.  Genitourinary: Negative for frequency and urgency.  Musculoskeletal: Negative for back pain and joint pain.  Neurological: Positive for weakness. Negative for sensory change, speech change and focal weakness.  Psychiatric/Behavioral: Negative for depression and hallucinations. The patient is not nervous/anxious.      MEDICATIONS AT HOME:   Prior to Admission medications  Medication Sig Start Date End Date Taking? Authorizing Provider  acetaminophen (TYLENOL) 325 MG tablet Take 650 mg by mouth every 6 (six) hours as needed for mild pain or headache.    Yes [provider]  aspirin EC 81 MG tablet Take 81 mg by mouth daily.   Yes [provider]  atorvastatin (LIPITOR) 40 MG tablet Take 40 mg by mouth daily.   Yes [provider]  calcium carbonate (TUMS - DOSED IN MG  ELEMENTAL CALCIUM) 500 MG chewable tablet Chew 1 tablet by mouth 2 (two) times daily as needed for indigestion or heartburn.   Yes [provider]  camphor-menthol Timoteo Ace) lotion Apply 1 application topically 3 (three) times daily as needed (sore legs).   Yes [provider]  cholecalciferol (VITAMIN D3) 25 MCG (1000 UT) tablet Take 1,000 Units by mouth daily.    Yes [provider]  furosemide (LASIX) 20 MG tablet Take 60 mg by mouth every morning.   Yes [provider]  glucagon (GLUCAGON EMERGENCY) 1 MG injection Inject 1 mg into the skin once as needed (hypoglycemic emergency).    Yes [provider]  glucose 4 GM chewable tablet Chew 1 tablet by mouth See admin instructions. Take 1 tablet by mouth for glucose less than 70 and repeat every 5 mintues until blood glucose greater than 70   Yes [provider]  insulin glargine (LANTUS) 100 UNIT/ML injection Inject 26 Units into the skin daily.    Yes [provider]  isosorbide mononitrate (IMDUR) 60 MG 24 hr tablet Take 60 mg by mouth daily.    Yes [provider]  lisinopril (PRINIVIL,ZESTRIL) 40 MG tablet Take 40 mg by mouth daily.    Yes [provider]  nitroGLYCERIN (NITROSTAT) 0.4 MG SL tablet Place 0.4 mg under the tongue every 5 (five) minutes as needed for chest pain.   Yes [provider]  polyethylene glycol powder (GLYCOLAX/MIRALAX) powder Take 17 g by mouth daily.   Yes [provider]  Semaglutide,0.25 or 0.5MG /DOS, (OZEMPIC, 0.25 OR 0.5 MG/DOSE,) 2 MG/1.5ML SOPN Inject 0.5 mg into the skin once a week.   Yes [provider]  senna (SENOKOT) 8.6 MG TABS tablet Take 2 tablets by mouth 2 (two) times daily.    Yes [provider]  simethicone (MYLICON) 80 MG chewable tablet Chew 80 mg by mouth 3 (three) times daily as needed for flatulence.   Yes [provider]  spironolactone (ALDACTONE) 25 MG tablet Take 25 mg by  mouth daily.   Yes [provider]  tiotropium (SPIRIVA) 18 MCG inhalation capsule Place 18 mcg into inhaler and inhale daily.   Yes [provider]  Vitamins A & D (VITAMIN A & D) ointment Apply 1 application topically daily as needed (skin protection - buttocks).   Yes [provider]  Zinc Oxide (DESITIN) 13 % CREA Apply 1 application topically 3 (three) times daily as needed (skin irritation).   Yes [provider]      VITAL SIGNS:  Blood pressure (!) 179/107, pulse 94, temperature 98.3 F (36.8 C), temperature source Oral, resp. rate 13, height 5\' 6"  (1.676 m), weight 121 kg, SpO2 98 %.  PHYSICAL EXAMINATION:  GENERAL:  73 y.o.-year-old patient lying in the bed with no acute distress. Obese EYES: Pupils equal, round, reactive to light and accommodation. No scleral icterus. Extraocular muscles intact.  HEENT: Head atraumatic, normocephalic. Oropharynx and nasopharynx clear.  NECK:  Supple, no jugular venous  distention. No thyroid enlargement, no tenderness.  LUNGS: decreased breath sounds bilaterally, no wheezing, few rales+,few  rhonchi or crepitation. No use of accessory muscles of respiration.  CARDIOVASCULAR: S1, S2 normal. No murmurs, rubs, or gallops.  ABDOMEN: Soft, nontender, nondistended. Bowel sounds present. No organomegaly or mass.  EXTREMITIES: + pedal edema No cyanosis, or clubbing.  Left below knee amputation NEUROLOGIC: Cranial nerves II through XII are intact. Muscle strength 5/5 in all extremities. Sensation intact. Gait not checked.  PSYCHIATRIC: The patient is alert and oriented x 3.  SKIN: No obvious rash, lesion, or ulcer.   LABORATORY PANEL:   CBC No results for input(s): WBC, HGB, HCT, PLT in the last 168 hours. ------------------------------------------------------------------------------------------------------------------  Chemistries  Recent Labs  Lab 01/14/19 1320  NA 135  K 4.5  CL 99  CO2 26  GLUCOSE 181*   BUN 16  CREATININE 1.67*  CALCIUM 8.7*  AST 18  ALT 14  ALKPHOS 82  BILITOT 1.1   ------------------------------------------------------------------------------------------------------------------  Cardiac Enzymes No results for input(s): TROPONINI in the last 168 hours. ------------------------------------------------------------------------------------------------------------------  RADIOLOGY:  Dg Chest Port 1 View  Result Date: 01/14/2019 CLINICAL DATA:  73 year old with acute onset of cough, shortness of breath and RIGHT LOWER extremity edema. Current history of diabetes, hypertension, COPD, and CHF. Former smoker. EXAM: PORTABLE CHEST 1 VIEW COMPARISON:  CT chest 12/06/2017 and earlier. Chest x-ray 09/17/2016 and earlier. FINDINGS: Cardiac silhouette mildly to moderately enlarged for AP technique, unchanged. Mild diffuse interstitial pulmonary edema. No confluent airspace consolidation. No visible pleural effusions. IMPRESSION: Mild CHF, with stable cardiomegaly and mild diffuse interstitial pulmonary edema. Electronically Signed   By: Evangeline Dakin M.D.   On: 01/14/2019 14:18    EKG:    IMPRESSION AND PLAN:   Barbara Farley  is a 73 y.o. female with a known history of morbid obesity, sleep apnea, CHF chronic diastolic, COPD, diabetes comes to the emergency room with increasing shortness of breath and leg edema. She was seen by her pace physician found to have saturations at 60%. She was placed on 2 L nasal cannula came to the emergency room found to be hypotensive with blood pressure in the 123456 systolic over diastolic 123XX123  1. Acute respiratory failure with hypoxia secondary to acute on chronic diastolic congestive heart failure -presented with increasing shortness of breath some congestion and leg swelling -admit to telemetry -IV Lasix 20 mg TID, monitor input output -REDS clips reading on admission -cardiology consultation with Dr. Nehemiah Massed. Message sent. -Continue  lisinopril and spironolactone -dietitian consult -- patient seems to be noncompliant with her diet  2. Malignant hypertension -Nitro paste 1 inch TID -continue lisinopril, spironolactone -consider be adding beta-blockers if blood pressure stays on the higher side  3. Elevated troponin to demand ischemia in the setting of heart failure -patient denies any chest pain -no acute EKG changes -continue to monitor  4. 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 Sleep apnea -patient uses CPAP at home  6. COPD-- mild exacerbation in the setting of CHF -PRN nebulizer and wean oxygen as tolerated  7. DVT prophylaxis subcu heparin  No family in the ER   All the records are reviewed and case discussed with ED provider.   CODE STATUS: DNR (patient carrying out of facility)  TOTAL TIME TAKING CARE OF THIS PATIENT: *55 minutes.    Barbara Farley M.D on 01/14/2019 at 4:17 PM  Between 7am to 6pm - Pager - [phone]  After 6pm go to [email or web address] - password [email or web address] [facility]  [facility]  Office  [phone]  CC: Primary care physician; [facility] [place]

## 2019-01-14 NOTE — ED Notes (Signed)
ED TO INPATIENT HANDOFF REPORT  ED Nurse Name and Phone #: Devonna Oboyle S Name/Age/Gender Barbara Farley 73 y.o. female Room/Bed: ED07A/ED07A  Code Status   Code Status: Prior  Home/SNF/Other Skilled nursing facility Patient oriented to: self, place, time and situation Is this baseline? Yes   Triage Complete: Triage complete  Chief Complaint sob  Triage Note Pt [facility] ED [facility] sent for weakness that started this morning and SOB.  Pt denies new pain or fevers.  Presents with cough but states not new.  Per Dr. Wendi Maya with [facility] states pt had some increased right leg edema, bilateral crackles in lungs, was found to be mid 60% RA and placed on 2L [place] and up to 94%.  Pt presents A&Ox4, labored breathing, speaking in broken sentences d/t catching breath.  Dr. Wendi Maya number [phone]   Allergies No Known Allergies  Level of Care/Admitting Diagnosis ED Disposition    ED Disposition Condition [place]: [place] [100120]  Level of Care: Telemetry [5]  Covid Evaluation: Confirmed COVID Negative  Diagnosis: [id number]  Admitting Physician: Odessa Fleming  Attending Physician: Odessa Fleming  Estimated length of stay: past midnight tomorrow  Certification:: I certify this patient will need inpatient services for at least 2 midnights  PT Class (Do Not Modify): Inpatient [101]  PT Acc Code (Do Not Modify): Private [1]       B Medical/Surgery History Past Medical History:  Diagnosis Date  . Anemia   . CAD (coronary artery disease)   . CHF (congestive heart failure) ([place])   . COPD (chronic obstructive pulmonary disease) ([place])   . Diabetes mellitus without complication ([place])   . Hx of BKA, left ([place]) 2008?  Marland Kitchen Hyperlipemia   . Hypertension   . Morbid obesity ([place])   . Seizures ([place])   . Sleep apnea    Past Surgical History:  Procedure Laterality Date  . BELOW  KNEE LEG AMPUTATION Left 2008  . BREAST BIOPSY Right 03/20/2017   3:00 FIBROEPITHELIAL LESION coil shape  . BREAST BIOPSY Right 03/20/2017   9:30 wing shape, fibroadenomatoid  . BREAST BIOPSY Right 02/28/2017   3:30 LIQ 2cmfn ribbon shape, FIBROEPITHELIAL LESION  . BREAST BIOPSY Right 09/12/2017   Right  LIQ, x shape marker right breast biopsy  with fibroadenomatous changes.     A IV Location/Drains/Wounds Patient Lines/Drains/Airways Status   Active Line/Drains/Airways    Name:   Placement date:   Placement time:   Site:   Days:   Peripheral IV 01/14/19 Right Forearm   01/14/19    1326    Forearm   less than 1          Intake/Output Last 24 hours No intake or output data in the 24 hours ending 01/14/19 1658  Labs/Imaging Results for orders placed or performed during the hospital encounter of 01/14/19 (from the past 48 hour(s))  Comprehensive metabolic panel     Status: Abnormal   Collection Time: 01/14/19  1:20 PM  Result Value Ref Range   Sodium 135 135 - 145 mmol/L   Potassium 4.5 3.5 - 5.1 mmol/L    Comment: HEMOLYSIS AT THIS LEVEL MAY AFFECT RESULT   Chloride 99 98 - 111 mmol/L   CO2 26 22 - 32 mmol/L   Glucose, Bld 181 (H) 70 - 99 mg/dL   BUN 16 8 - 23 mg/dL   Creatinine, Ser 1.67 (  H) 0.44 - 1.00 mg/dL   Calcium 8.7 (L) 8.9 - 10.3 mg/dL   Total Protein 6.4 (L) 6.5 - 8.1 g/dL   Albumin 3.4 (L) 3.5 - 5.0 g/dL   AST 18 15 - 41 U/L   ALT 14 0 - 44 U/L   Alkaline Phosphatase 82 38 - 126 U/L   Total Bilirubin 1.1 0.3 - 1.2 mg/dL   GFR calc non Af Amer 30 (L) >60 mL/min   GFR calc Af Amer 35 (L) >60 mL/min   Anion gap 10 5 - 15    Comment: Performed at [facility], [address] [place], [place] [address]  Troponin I (High Sensitivity)     Status: Abnormal   Collection Time: 01/14/19  1:20 PM  Result Value Ref Range   Troponin I (High Sensitivity) 120 (HH) <18 ng/L    Comment: CRITICAL RESULT CALLED TO, READ BACK BY AND VERIFIED WITH ASHLEY SMITH AT  [id number] 01/14/2019 DAS (NOTE) Elevated high sensitivity troponin I (hsTnI) values and significant  changes across serial measurements may suggest ACS but many other  chronic and acute conditions are known to elevate hsTnI results.  Refer to the "Links" section for chest pain algorithms and additional  guidance. Performed at [facility], [address] [place], [place] [address]   SARS Coronavirus 2 [facility] order, Performed in [facility] hospital lab) Nasopharyngeal Nasopharyngeal Swab     Status: None   Collection Time: 01/14/19  2:20 PM   Specimen: Nasopharyngeal Swab  Result Value Ref Range   SARS Coronavirus 2 NEGATIVE NEGATIVE    Comment: (NOTE) If result is NEGATIVE SARS-CoV-2 target nucleic acids are NOT DETECTED. The SARS-CoV-2 RNA is generally detectable in upper and lower  respiratory specimens during the acute phase of infection. The lowest  concentration of SARS-CoV-2 viral copies this assay can detect is 250  copies / mL. A negative result does not preclude SARS-CoV-2 infection  and should not be used as the sole basis for treatment or other  patient management decisions.  A negative result may occur with  improper specimen collection / handling, submission of specimen other  than nasopharyngeal swab, presence of viral mutation(s) within the  areas targeted by this assay, and inadequate number of viral copies  (<250 copies / mL). A negative result must be combined with clinical  observations, patient history, and epidemiological information. If result is POSITIVE SARS-CoV-2 target nucleic acids are DETECTED. The SARS-CoV-2 RNA is generally detectable in upper and lower  respiratory specimens dur ing the acute phase of infection.  Positive  results are indicative of active infection with SARS-CoV-2.  Clinical  correlation with patient history and other diagnostic information is  necessary to determine patient infection status.  Positive results do  not rule  out bacterial infection or co-infection with other viruses. If result is PRESUMPTIVE POSTIVE SARS-CoV-2 nucleic acids MAY BE PRESENT.   A presumptive positive result was obtained on the submitted specimen  and confirmed on repeat testing.  While 2019 novel coronavirus  (SARS-CoV-2) nucleic acids may be present in the submitted sample  additional confirmatory testing may be necessary for epidemiological  and / or clinical management purposes  to differentiate between  SARS-CoV-2 and other Sarbecovirus currently known to infect humans.  If clinically indicated additional testing with an alternate test  methodology [phone]) is advised. The SARS-CoV-2 RNA is generally  detectable in upper and lower respiratory sp ecimens during the acute  phase of infection. The expected result  is Negative. Fact Sheet for Patients:  StrictlyIdeas.no Fact Sheet for Healthcare Providers: BankingDealers.co.za This test is not yet approved or cleared by the [place] FDA and has been authorized for detection and/or diagnosis of SARS-CoV-2 by FDA under an Emergency Use Authorization (EUA).  This EUA will remain in effect (meaning this test can be used) for the duration of the COVID-19 declaration under Section 564(b)(1) of the Act, 21 U.S.C. section 360bbb-3(b)(1), unless the authorization is terminated or revoked sooner. Performed at [facility], [address] [place], [place] [address]    Dg Chest [place] 1 View  Result Date: 01/14/2019 CLINICAL DATA:  73 year old with acute onset of cough, shortness of breath and RIGHT LOWER extremity edema. Current history of diabetes, hypertension, COPD, and CHF. Former smoker. EXAM: PORTABLE CHEST 1 VIEW COMPARISON:  CT chest 12/06/2017 and earlier. Chest x-ray 09/17/2016 and earlier. FINDINGS: Cardiac silhouette mildly to moderately enlarged for AP technique, unchanged. Mild diffuse interstitial pulmonary edema.  No confluent airspace consolidation. No visible pleural effusions. IMPRESSION: Mild CHF, with stable cardiomegaly and mild diffuse interstitial pulmonary edema. Electronically Signed   By: Evangeline Dakin M.D.   On: 01/14/2019 14:18    Pending Labs Unresulted Labs (From admission, onward)    Start     Ordered   01/14/19 1612  Hemoglobin A1c  Once,   STAT    Comments: To assess prior glycemic control    01/14/19 1611   01/14/19 1415  Brain natriuretic peptide  Once,   STAT     01/14/19 1415   01/14/19 1415  CBC with Differential/Platelet  Once,   STAT     01/14/19 1415   01/14/19 1323  CBC with Differential  ONCE - STAT,   STAT     01/14/19 1322   Signed and Held  Basic metabolic panel  Daily,   R     Signed and Held   Signed and Held  CBC  (heparin)  Once,   R    Comments: Baseline for heparin therapy IF NOT ALREADY DRAWN.  Notify MD if PLT < 100 K.    Signed and Held   Signed and Held  Creatinine, serum  (heparin)  Once,   R    Comments: Baseline for heparin therapy IF NOT ALREADY DRAWN.    Signed and Held          Vitals/Pain Today's Vitals   01/14/19 1400 01/14/19 1430 01/14/19 1500 01/14/19 1600  BP: (!) 143/68 (!) 171/91 (!) 179/107   Pulse:  96  94  Resp: 18 18 (!) 32 13  Temp:      TempSrc:      SpO2:  95%  98%  Weight:      Height:      PainSc:        Isolation Precautions No active isolations  Medications Medications  nitroGLYCERIN (NITROGLYN) 2 % ointment 1 inch (1 inch Topical Given 01/14/19 1601)  insulin aspart (novoLOG) injection 0-9 Units (has no administration in time range)  insulin aspart (novoLOG) injection 0-5 Units (has no administration in time range)  methylPREDNISolone sodium succinate (SOLU-MEDROL) 125 mg/2 mL injection 125 mg (125 mg Intravenous Given 01/14/19 1421)  furosemide (LASIX) injection 40 mg (40 mg Intravenous Given 01/14/19 1513)  ipratropium-albuterol (DUONEB) 0.5-2.5 (3) MG/3ML nebulizer solution 3 mL (3 mLs Nebulization Given  01/14/19 1550)    Mobility walks with device High fall risk   Focused Assessments Cardiac Assessment Handoff:  Cardiac Rhythm: Normal sinus rhythm  Lab Results  Component Value Date   CKTOTAL 143 05/17/2013   CKMB 1.8 05/17/2013   TROPONINI <0.03 09/17/2016   No results found for: DDIMER Does the Patient currently have chest pain? No     R Recommendations: See Admitting Provider Note  Report given to:   Additional Notes: none

## 2019-01-14 NOTE — ED Notes (Signed)
Resumed care from [place] s rn.  Pt alert.meds given.  Iv in place.  Sinus on monitor at 100.

## 2019-01-14 NOTE — ED Triage Notes (Signed)
[facility] sent for weakness that started this morning and SOB.  Pt denies new pain or fevers.  Presents with cough but states not new.  Per Dr. Wendi Maya with [facility] states pt had some increased right leg edema, bilateral crackles in lungs, was found to be mid 60% RA and placed on 2L [place] and up to 94%.  Pt presents A&Ox4, labored breathing, speaking in broken sentences d/t catching breath.  Dr. Wendi Maya number [phone]

## 2019-01-14 NOTE — ED Notes (Signed)
primedoc in with pt for admission    No chest pain pt talking on cell phone.

## 2019-01-14 NOTE — ED Notes (Signed)
Pt is on 2 liters oxygen nasal cannula.  nsr on monitor

## 2019-01-15 ENCOUNTER — Inpatient Hospital Stay
Admit: 2019-01-15 | Discharge: 2019-01-15 | Disposition: A | Discharge status: Home / Self Care | Payer: Medicare (Managed Care) | Attending: Internal Medicine | Admitting: Internal Medicine

## 2019-01-15 DIAGNOSIS — I5031 Acute diastolic (congestive) heart failure: Secondary | ICD-10-CM

## 2019-01-15 LAB — GLUCOSE, CAPILLARY
Glucose-Capillary: 340 mg/dL — ABNORMAL HIGH (ref 70–99)
Glucose-Capillary: 334 mg/dL — ABNORMAL HIGH (ref 70–99)
Glucose-Capillary: 338 mg/dL — ABNORMAL HIGH (ref 70–99)

## 2019-01-15 LAB — BASIC METABOLIC PANEL
Anion gap: 11 (ref 5–15)
BUN: 22 mg/dL (ref 8–23)
CO2: 29 mmol/L (ref 22–32)
Calcium: 9.1 mg/dL (ref 8.9–10.3)
Chloride: 97 mmol/L — ABNORMAL LOW (ref 98–111)
Creatinine, Ser: 1.73 mg/dL — ABNORMAL HIGH (ref 0.44–1.00)
GFR calc Af Amer: 34 mL/min — ABNORMAL LOW (ref >60)
GFR calc non Af Amer: 29 mL/min — ABNORMAL LOW (ref >60)
Glucose, Bld: 317 mg/dL — ABNORMAL HIGH (ref 70–99)
Potassium: 4.7 mmol/L (ref 3.5–5.1)
Sodium: 137 mmol/L (ref 135–145)

## 2019-01-15 LAB — ECHOCARDIOGRAM COMPLETE
Height: 66 in
Weight: 4195.2 oz

## 2019-01-15 MED ORDER — INSULIN ASPART 100 UNIT/ML ~~LOC~~ SOLN
3.0000 [IU] | Freq: Three times a day (TID) | SUBCUTANEOUS | Status: DC
  Administered 2019-01-15 – 2019-01-16 (×2): 3 [IU] via SUBCUTANEOUS
  Filled 2019-01-15 (×2): qty 1

## 2019-01-15 MED ORDER — PERFLUTREN LIPID MICROSPHERE
1.0000 mL | INTRAVENOUS | Status: AC | PRN
  Administered 2019-01-15: 10:00:00 2 mL via INTRAVENOUS
  Filled 2019-01-15: qty 10

## 2019-01-15 MED ORDER — CARVEDILOL 3.125 MG PO TABS
3.1250 mg | ORAL_TABLET | Freq: Two times a day (BID) | ORAL | Status: DC
  Administered 2019-01-15 – 2019-01-16 (×2): 3.125 mg via ORAL
  Filled 2019-01-15 (×2): qty 1

## 2019-01-15 NOTE — Progress Notes (Signed)
*  PRELIMINARY RESULTS* Echocardiogram 2D Echocardiogram has been performed.  Barbara Farley 01/15/2019, 10:02 AM

## 2019-01-15 NOTE — Progress Notes (Signed)
Inpatient Diabetes Program Recommendations  AACE/ADA: New Consensus Statement on Inpatient Glycemic Control (2015)  Target Ranges:  Prepandial:   less than 140 mg/dL      Peak postprandial:   less than 180 mg/dL (1-2 hours)      Critically ill patients:  140 - 180 mg/dL   Lab Results  Component Value Date   GLUCAP 340 (H) 01/15/2019   HGBA1C 8.0 (H) 06/18/2015    Review of Glycemic Control Results for Barbara Farley, Barbara Farley (MRN [id number]) as of 01/15/2019 12:34  Ref. Range 01/14/2019 20:58 01/15/2019 11:41  Glucose-Capillary Latest Ref Range: 70 - 99 mg/dL 283 (H) 340 (H)   Diabetes history: DM2 Outpatient Diabetes medications: Lantus 26 units + Ozempic Current orders for Inpatient glycemic control: Lantus 26 units +Novolog sensitive correction tid + hs 0-5 units -Increase Novolog correction to moderate correction tid + hs 0-5 units  Inpatient Diabetes Program Recommendations:   -Increase Novolog correction to moderate correction tid + hs 0-5 units  Thank you, Bethena Roys E. Julianne Chamberlin, RN, MSN, CDE  Diabetes Coordinator Inpatient Glycemic Control Team Team Pager [phone] (8am-5pm) 01/15/2019 12:36 PM

## 2019-01-15 NOTE — Plan of Care (Signed)
  Problem: Education: Goal: Knowledge of General Education information will improve Description: Including pain rating scale, medication(s)/side effects and non-pharmacologic comfort measures Outcome: Progressing   Problem: Health Behavior/Discharge Planning: Goal: Ability to manage health-related needs will improve Outcome: Progressing   Problem: Clinical Measurements: Goal: Ability to maintain clinical measurements within normal limits will improve Outcome: Not Progressing Note: B.S. continues to climb. PT states as reason she's not seen today. Three units of insulin with meal coverage added for this afternoon. Will continue to monitor lab values. Wenda Low [facility]

## 2019-01-15 NOTE — Progress Notes (Signed)
[place] at [facility]                                                                                                                                                                                  Patient Demographics   Barbara Farley, is a 73 y.o. female, DOB - 19-Aug-1945, [id number]  Admit date - 01/14/2019   Admitting Physician Fritzi Mandes, MD  Outpatient Primary MD for the patient is [facility]   LOS - 1  Subjective: Patient admitted with shortness of breath she is doing better.  Is diuresing well.  Review of Systems:   CONSTITUTIONAL: No documented fever. No fatigue, weakness. No weight gain, no weight loss.  EYES: No blurry or double vision.  ENT: No tinnitus. No postnasal drip. No redness of the oropharynx.  RESPIRATORY: No cough, no wheeze, no hemoptysis.  Positive dyspnea.  CARDIOVASCULAR: No chest pain. No orthopnea. No palpitations. No syncope.  GASTROINTESTINAL: No nausea, no vomiting or diarrhea. No abdominal pain. No melena or hematochezia.  GENITOURINARY: No dysuria or hematuria.  ENDOCRINE: No polyuria or nocturia. No heat or cold intolerance.  HEMATOLOGY: No anemia. No bruising. No bleeding.  INTEGUMENTARY: No rashes. No lesions.  MUSCULOSKELETAL: No arthritis. No swelling. No gout.  NEUROLOGIC: No numbness, tingling, or ataxia. No seizure-type activity.  PSYCHIATRIC: No anxiety. No insomnia. No ADD.    Vitals:   Vitals:   01/14/19 1807 01/14/19 1939 01/15/19 0425 01/15/19 0757  BP: (!) 171/91 140/71 (!) 148/95 (!) 168/96  Pulse: (!) 101 (!) 109 (!) 103 95  Resp: (!) 21 20 20 20   Temp: 98.5 F (36.9 C) 98.4 F (36.9 C) 98 F (36.7 C) (!) 97.4 F (36.3 C)  TempSrc: Oral Oral Oral Oral  SpO2: 93% 94% 92% 93%  Weight: 118.9 kg  118.9 kg   Height: 5\' 6"  (1.676 m)       Wt Readings from Last 3 Encounters:  01/15/19 118.9 kg  12/13/18 98.9 kg  04/11/18 127 kg     Intake/Output Summary (Last 24  hours) at 01/15/2019 1349 Last data filed at 01/15/2019 0137 Gross per 24 hour  Intake -  Output 1200 ml  Net -1200 ml    Physical Exam:   GENERAL: Pleasant-appearing in no apparent distress.  HEAD, EYES, EARS, NOSE AND THROAT: Atraumatic, normocephalic. Extraocular muscles are intact. Pupils equal and reactive to light. Sclerae anicteric. No conjunctival injection. No oro-pharyngeal erythema.  NECK: Supple. There is no jugular venous distention. No bruits, no lymphadenopathy, no thyromegaly.  HEART: Regular rate and rhythm,. No murmurs, no rubs, no clicks.  LUNGS: Crackles at the bases no accessory muscle usage ABDOMEN:  Soft, flat, nontender, nondistended. Has good bowel sounds. No hepatosplenomegaly appreciated.  EXTREMITIES: No evidence of any cyanosis, clubbing, or peripheral edema.  +2 pedal and radial pulses bilaterally.  NEUROLOGIC: The patient is alert, awake, and oriented x3 with no focal motor or sensory deficits appreciated bilaterally.  SKIN: Moist and warm with no rashes appreciated.  Psych: Not anxious, depressed LN: No inguinal LN enlargement    Antibiotics   Anti-infectives (From admission, onward)   None      Medications   Scheduled Meds: . aspirin EC  81 mg Oral Daily  . atorvastatin  40 mg Oral Daily  . cholecalciferol  1,000 Units Oral Daily  . furosemide  20 mg Intravenous TID  . heparin  5,000 Units Subcutaneous Q8H  . influenza vaccine adjuvanted  0.5 mL Intramuscular Tomorrow-1000  . insulin aspart  0-5 Units Subcutaneous QHS  . insulin aspart  0-9 Units Subcutaneous TID WC  . insulin glargine  26 Units Subcutaneous QHS  . isosorbide mononitrate  60 mg Oral Daily  . lisinopril  40 mg Oral Daily  . nitroGLYCERIN  1 inch Topical Q8H  . pneumococcal 23 valent vaccine  0.5 mL Intramuscular Tomorrow-1000  . polyethylene glycol powder  17 g Oral Daily  . senna  2 tablet Oral BID  . sodium chloride flush  3 mL Intravenous Q12H  . spironolactone  25 mg  Oral Daily  . tiotropium  18 mcg Inhalation Daily   Continuous Infusions: . sodium chloride     PRN Meds:.sodium chloride, acetaminophen, calcium carbonate, ondansetron (ZOFRAN) IV, simethicone, sodium chloride flush   Data Review:   Micro Results Recent Results (from the past 240 hour(s))  SARS Coronavirus 2 [facility] order, Performed in [facility] hospital lab) Nasopharyngeal Nasopharyngeal Swab     Status: None   Collection Time: 01/14/19  2:20 PM   Specimen: Nasopharyngeal Swab  Result Value Ref Range Status   SARS Coronavirus 2 NEGATIVE NEGATIVE Final    Comment: (NOTE) If result is NEGATIVE SARS-CoV-2 target nucleic acids are NOT DETECTED. The SARS-CoV-2 RNA is generally detectable in upper and lower  respiratory specimens during the acute phase of infection. The lowest  concentration of SARS-CoV-2 viral copies this assay can detect is 250  copies / mL. A negative result does not preclude SARS-CoV-2 infection  and should not be used as the sole basis for treatment or other  patient management decisions.  A negative result may occur with  improper specimen collection / handling, submission of specimen other  than nasopharyngeal swab, presence of viral mutation(s) within the  areas targeted by this assay, and inadequate number of viral copies  (<250 copies / mL). A negative result must be combined with clinical  observations, patient history, and epidemiological information. If result is POSITIVE SARS-CoV-2 target nucleic acids are DETECTED. The SARS-CoV-2 RNA is generally detectable in upper and lower  respiratory specimens dur ing the acute phase of infection.  Positive  results are indicative of active infection with SARS-CoV-2.  Clinical  correlation with patient history and other diagnostic information is  necessary to determine patient infection status.  Positive results do  not rule out bacterial infection or co-infection with other viruses. If result is  PRESUMPTIVE POSTIVE SARS-CoV-2 nucleic acids MAY BE PRESENT.   A presumptive positive result was obtained on the submitted specimen  and confirmed on repeat testing.  While 2019 novel coronavirus  (SARS-CoV-2) nucleic acids may be present in the submitted sample  additional confirmatory testing may  be necessary for epidemiological  and / or clinical management purposes  to differentiate between  SARS-CoV-2 and other Sarbecovirus currently known to infect humans.  If clinically indicated additional testing with an alternate test  methodology [phone]) is advised. The SARS-CoV-2 RNA is generally  detectable in upper and lower respiratory sp ecimens during the acute  phase of infection. The expected result is Negative. Fact Sheet for Patients:  StrictlyIdeas.no Fact Sheet for Healthcare Providers: BankingDealers.co.za This test is not yet approved or cleared by the [place] FDA and has been authorized for detection and/or diagnosis of SARS-CoV-2 by FDA under an Emergency Use Authorization (EUA).  This EUA will remain in effect (meaning this test can be used) for the duration of the COVID-19 declaration under Section 564(b)(1) of the Act, 21 U.S.C. section 360bbb-3(b)(1), unless the authorization is terminated or revoked sooner. Performed at [facility], [address] [place], [place] [address]     Radiology Reports Dg Chest [address] 1 View  Result Date: 01/14/2019 CLINICAL DATA:  73 year old with acute onset of cough, shortness of breath and RIGHT LOWER extremity edema. Current history of diabetes, hypertension, COPD, and CHF. Former smoker. EXAM: PORTABLE CHEST 1 VIEW COMPARISON:  CT chest 12/06/2017 and earlier. Chest x-ray 09/17/2016 and earlier. FINDINGS: Cardiac silhouette mildly to moderately enlarged for AP technique, unchanged. Mild diffuse interstitial pulmonary edema. No confluent airspace consolidation. No visible  pleural effusions. IMPRESSION: Mild CHF, with stable cardiomegaly and mild diffuse interstitial pulmonary edema. Electronically Signed   By: Evangeline Dakin M.D.   On: 01/14/2019 14:18     CBC Recent Labs  Lab 01/14/19 1420  WBC 8.2  HGB 13.5  HCT 44.8  PLT 186  MCV 92.6  MCH 27.9  MCHC 30.1  RDW 13.1  LYMPHSABS 0.4*  MONOABS 0.1  EOSABS 0.0  BASOSABS 0.0    Chemistries  Recent Labs  Lab 01/14/19 1320 01/14/19 1902 01/15/19 0714  NA 135  --  137  K 4.5  --  4.7  CL 99  --  97*  CO2 26  --  29  GLUCOSE 181*  --  317*  BUN 16  --  22  CREATININE 1.67* 1.61* 1.73*  CALCIUM 8.7*  --  9.1  AST 18  --   --   ALT 14  --   --   ALKPHOS 82  --   --   BILITOT 1.1  --   --    ------------------------------------------------------------------------------------------------------------------ estimated creatinine clearance is 39.1 mL/min (A) (by C-G formula based on SCr of 1.73 mg/dL (H)). ------------------------------------------------------------------------------------------------------------------ No results for input(s): HGBA1C in the last 72 hours. ------------------------------------------------------------------------------------------------------------------ No results for input(s): CHOL, HDL, LDLCALC, TRIG, CHOLHDL, LDLDIRECT in the last 72 hours. ------------------------------------------------------------------------------------------------------------------ No results for input(s): TSH, T4TOTAL, T3FREE, THYROIDAB in the last 72 hours.  Invalid input(s): FREET3 ------------------------------------------------------------------------------------------------------------------ No results for input(s): VITAMINB12, FOLATE, FERRITIN, TIBC, IRON, RETICCTPCT in the last 72 hours.  Coagulation profile No results for input(s): INR, PROTIME in the last 168 hours.  No results for input(s): DDIMER in the last 72 hours.  Cardiac Enzymes No results for input(s): CKMB,  TROPONINI, MYOGLOBIN in the last 168 hours.  Invalid input(s): CK ------------------------------------------------------------------------------------------------------------------ Invalid input(s): [place]  is a 73 y.o. female with a known history of morbid obesity, sleep apnea, CHF chronic diastolic, COPD, diabetes comes to the emergency room with increasing shortness of breath and leg edema. She was seen by her pace  physician found to have saturations at 60%. She was placed on 2 L nasal cannula came to the emergency room found to be hypotensive with blood pressure in the 123456 systolic over diastolic [id number]  1. Acute respiratory failure with hypoxia secondary to acute on chronic diastolic congestive heart failure -Continue IV Lasix 20 mg TID, monitor input output -cardiology consultation appreciated. -Continue lisinopril and spironolactone -dietitian consult -- patient seems to be noncompliant with her diet  -Occult the heart pending  2. Malignant hypertension -Nitro paste 1 inch TID -continue lisinopril, spironolactone -We will add Coreg  3. Elevated troponin to demand ischemia in the setting of heart failure -patient denies any chest pain -no acute EKG changes -continue to monitor  4. 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 Sleep apnea -patient uses CPAP at home  6. COPD-- mild exacerbation in the setting of CHF -PRN nebulizer and wean oxygen as tolerated  7. DVT prophylaxis subcu heparin     Code Status Orders  (From admission, onward)         Start     Ordered   01/14/19 1758  Do not attempt resuscitation (DNR)  Continuous    Question Answer Comment  In the event of cardiac or respiratory ARREST Do not call a "code blue"   In the event of cardiac or respiratory ARREST Do not perform Intubation, CPR, defibrillation or ACLS   In the event of cardiac or respiratory  ARREST Use medication by any route, position, wound care, and other measures to relive pain and suffering. May use oxygen, suction and manual treatment of airway obstruction as needed for comfort.   Comments HAs DNR papers      01/14/19 1757        Code Status History    Date Active Date Inactive Code Status Order ID Comments User Context   06/18/2015 1518 06/19/2015 1726 Full Code [id number]  Dustin Flock, MD ED   Advance Care Planning Activity    Advance Directive Documentation     Most Recent Value  Type of Advance Directive  Out of facility DNR (pink MOST or yellow form), Living will, Healthcare Power of Attorney  Pre-existing out of facility DNR order (yellow form or pink MOST form)  -  "MOST" Form in Place?  -           Consults cardiology  DVT Prophylaxis Heparin  Lab Results  Component Value Date   PLT 186 01/14/2019     Time Spent in minutes 35 minutes  Greater than 50% of time spent in care coordination and counseling patient regarding the condition and plan of care.   Dustin Flock M.D on 01/15/2019 at 1:49 PM  Between 7am to 6pm - Pager - [phone]  After 6pm go to [email or web address] - Proofreader  [employer]   Office  [phone]

## 2019-01-15 NOTE — Plan of Care (Signed)
Nutrition Education Note  RD consulted for nutrition education regarding CHF.  73 y.o. female with a known history of morbid obesity, sleep apnea, CHF chronic diastolic, COPD, diabetes comes to the emergency room with increasing shortness of breath and leg edema. Pt with CHF  RD provided "Low Sodium Nutrition Therapy" handout from the Academy of Nutrition and Dietetics. Reviewed patient's dietary recall. Provided examples on ways to decrease sodium intake in diet. Discouraged intake of processed foods and use of salt shaker. Encouraged fresh fruits and vegetables as well as whole grain sources of carbohydrates to maximize fiber intake.   RD discussed why it is important for patient to adhere to diet recommendations, and emphasized the role of fluids, foods to avoid, and importance of weighing self daily. Teach back method used.  Expect good compliance.  Body mass index is 42.32 kg/m. Pt meets criteria for morbid obesity based on current BMI.  Current diet order is HH, patient is consuming approximately 100% of meals at this time. Labs and medications reviewed. No further nutrition interventions warranted at this time. RD contact information provided. If additional nutrition issues arise, please re-consult RD.   Barbara Distance MS, RD, LDN Pager #- [phone] Office#- [phone] After Hours Pager: [phone]

## 2019-01-15 NOTE — Consult Note (Addendum)
[facility] Cardiology  CARDIOLOGY CONSULT NOTE  Patient ID: CHRISTON RINDLER MRN: [id number] DOB/AGE: [birth year] 73 y.o.  Admit date: 01/14/2019 Referring Physician Dr. Posey Pronto Primary Physician: [facility] Primary Cardiologist: Dr. Saralyn Pilar (last visit in 2017) Reason for Consultation Shortness of breath   HPI:  RECA VANLOAN is a 73 y.o. with a past medical history of heart failure with preserved ejection fraction (last ECHO completed 04/2018 with EF of ~55%), COPD, diabetes mellitus, HTN, and HLD, who presented to the ED yesterday with two days of progressive shortness of breath and leg swelling. Shortness of breath was most noticeable when exerting herself but she reported having some shortness of breath even with rest. She has also endorsed a cough and rhinorrhea.   Prior to her ER evaluation, she received an outpatient assessment which showed reported O2 saturation of 60%. She was placed on 2 L nasal cannula and was transferred to the ER for further evaluation. Of note, she does not use oxygen at home. No known sick contacts.   In the ER she was found to be hypertensive to ~170/90. Creatinine at baseline. Troponin initially elevated to 120 (no second troponin was drawn). BNP mildly elevated to 216. CXR suggesting mild pulmonary edema.   Review of systems complete and found to be negative unless listed above   Past Medical History:  Diagnosis Date  . Anemia   . CAD (coronary artery disease)   . CHF (congestive heart failure) ([place])   . COPD (chronic obstructive pulmonary disease) ([place])   . Diabetes mellitus without complication ([place])   . Hx of BKA, left ([place]) 2008?  [place] Hyperlipemia   . Hypertension   . Morbid obesity ([place])   . Seizures ([place])   . Sleep apnea     Past Surgical History:  Procedure Laterality Date  . BELOW KNEE LEG AMPUTATION Left 2008  . BREAST BIOPSY Right 03/20/2017   3:00 FIBROEPITHELIAL LESION coil shape  . BREAST BIOPSY Right 03/20/2017   9:30 wing shape, fibroadenomatoid  . BREAST BIOPSY Right 02/28/2017   3:30 LIQ 2cmfn ribbon shape, FIBROEPITHELIAL LESION  . BREAST BIOPSY Right 09/12/2017   Right  LIQ, x shape marker right breast biopsy  with fibroadenomatous changes.    Medications Prior to Admission  Medication Sig Dispense Refill Last Dose  . acetaminophen (TYLENOL) 325 MG tablet Take 650 mg by mouth every 6 (six) hours as needed for mild pain or headache.    Unknown at PRN  . aspirin EC 81 MG tablet Take 81 mg by mouth daily.   Past Week at Unknown time  . atorvastatin (LIPITOR) 40 MG tablet Take 40 mg by mouth daily.   Past Week at Unknown time  . calcium carbonate (TUMS - DOSED IN MG ELEMENTAL CALCIUM) 500 MG chewable tablet Chew 1 tablet by mouth 2 (two) times daily as needed for indigestion or heartburn.   Unknown at PRN  . camphor-menthol (SARNA) lotion Apply 1 application topically 3 (three) times daily as needed (sore legs).   Unknown at PRN  . cholecalciferol (VITAMIN D3) 25 MCG (1000 UT) tablet Take 1,000 Units by mouth daily.    Past Week at Unknown time  . furosemide (LASIX) 20 MG tablet Take 60 mg by mouth every morning.   Past Week at Unknown time  . glucagon (GLUCAGON EMERGENCY) 1 MG injection Inject 1 mg into the skin once as needed (hypoglycemic emergency).    Unknown at PRN  . glucose 4 GM chewable tablet Chew  1 tablet by mouth See admin instructions. Take 1 tablet by mouth for glucose less than 70 and repeat every 5 mintues until blood glucose greater than 70   Unknown at PRN  . insulin glargine (LANTUS) 100 UNIT/ML injection Inject 26 Units into the skin daily.    Past Week at Unknown time  . isosorbide mononitrate (IMDUR) 60 MG 24 hr tablet Take 60 mg by mouth daily.    Past Week at Unknown time  . lisinopril (PRINIVIL,ZESTRIL) 40 MG tablet Take 40 mg by mouth daily.    Past Week at Unknown time  . nitroGLYCERIN (NITROSTAT) 0.4 MG SL tablet Place 0.4 mg under the tongue every 5 (five) minutes as needed  for chest pain.   Unknown at PRN  . polyethylene glycol powder (GLYCOLAX/MIRALAX) powder Take 17 g by mouth daily.   Past Week at Unknown time  . Semaglutide,0.25 or 0.5MG /DOS, (OZEMPIC, 0.25 OR 0.5 MG/DOSE,) 2 MG/1.5ML SOPN Inject 0.5 mg into the skin once a week.     . senna (SENOKOT) 8.6 MG TABS tablet Take 2 tablets by mouth 2 (two) times daily.    Past Week at Unknown time  . simethicone (MYLICON) 80 MG chewable tablet Chew 80 mg by mouth 3 (three) times daily as needed for flatulence.   Unknown at PRN  . spironolactone (ALDACTONE) 25 MG tablet Take 25 mg by mouth daily.   Past Week at Unknown time  . tiotropium (SPIRIVA) 18 MCG inhalation capsule Place 18 mcg into inhaler and inhale daily.   Past Week at Unknown time  . Vitamins A & D (VITAMIN A & D) ointment Apply 1 application topically daily as needed (skin protection - buttocks).   Unknown at PRN  . Zinc Oxide (DESITIN) 13 % CREA Apply 1 application topically 3 (three) times daily as needed (skin irritation).   Unknown at PRN   Social History   Socioeconomic History  . Marital status: Widowed    Spouse name: Not on file  . Number of children: Not on file  . Years of education: Not on file  . Highest education level: Not on file  Occupational History  . Not on file  Social Needs  . Financial resource strain: Not on file  . Food insecurity    Worry: Not on file    Inability: Not on file  . Transportation needs    Medical: Not on file    Non-medical: Not on file  Tobacco Use  . Smoking status: Former Smoker    Types: Cigarettes  . Smokeless tobacco: Never Used  Substance and Sexual Activity  . Alcohol use: No    Alcohol/week: 0.0 standard drinks  . Drug use: No  . Sexual activity: Never    Birth control/protection: None  Lifestyle  . Physical activity    Days per week: Not on file    Minutes per session: Not on file  . Stress: Not on file  Relationships  . Social Herbalist on phone: Not on file    Gets  together: Not on file    Attends religious service: Not on file    Active member of club or organization: Not on file    Attends meetings of clubs or organizations: Not on file    Relationship status: Not on file  . Intimate partner violence    Fear of current or ex partner: Not on file    Emotionally abused: Not on file    Physically abused: Not  on file    Forced sexual activity: Not on file  Other Topics Concern  . Not on file  Social History Narrative  . Not on file    Family History  Problem Relation Age of Onset  . Diabetes Other   . Breast cancer Mother        in early 26's      Review of systems complete and found to be negative unless listed above   PHYSICAL EXAM  General: Well developed, well nourished, in no acute distress HEENT:  Normocephalic and atramatic. Clear rhinorrhea present.  Neck:  No JVD.  Lungs: Scant bi-basilar crackles posteriorly. No wheezing.  Heart: HRRR. Normal S1 and S2 without gallops or murmurs.  Extremities: 2+ pitting edema to the mid-shins bilaterally.   Neuro: Alert and oriented X 3. Psych:  Good affect, responds appropriately  Labs:   Lab Results  Component Value Date   WBC 8.2 01/14/2019   HGB 13.5 01/14/2019   HCT 44.8 01/14/2019   MCV 92.6 01/14/2019   PLT 186 01/14/2019    Recent Labs  Lab 01/14/19 1320  01/15/19 0714  NA 135  --  137  K 4.5  --  4.7  CL 99  --  97*  CO2 26  --  29  BUN 16  --  22  CREATININE 1.67*   < > 1.73*  CALCIUM 8.7*  --  9.1  PROT 6.4*  --   --   BILITOT 1.1  --   --   ALKPHOS 82  --   --   ALT 14  --   --   AST 18  --   --   GLUCOSE 181*  --  317*   < > = values in this interval not displayed.   Lab Results  Component Value Date   CKTOTAL 143 05/17/2013   CKMB 1.8 05/17/2013   TROPONINI <0.03 09/17/2016   No results found for: CHOL No results found for: HDL No results found for: LDLCALC No results found for: TRIG No results found for: CHOLHDL No results found for: LDLDIRECT     Radiology: Dg Chest Port 1 View  Result Date: 01/14/2019 CLINICAL DATA:  72 year old with acute onset of cough, shortness of breath and RIGHT LOWER extremity edema. Current history of diabetes, hypertension, COPD, and CHF. Former smoker. EXAM: PORTABLE CHEST 1 VIEW COMPARISON:  CT chest 12/06/2017 and earlier. Chest x-ray 09/17/2016 and earlier. FINDINGS: Cardiac silhouette mildly to moderately enlarged for AP technique, unchanged. Mild diffuse interstitial pulmonary edema. No confluent airspace consolidation. No visible pleural effusions. IMPRESSION: Mild CHF, with stable cardiomegaly and mild diffuse interstitial pulmonary edema. Electronically Signed   By: Evangeline Dakin M.D.   On: 01/14/2019 14:18    EKG:  Sinus rhythm, Rate of 97 BPM, normal axis, normal intervals, no significant ST-T wave changes  ASSESSMENT AND PLAN:  Ms. Nissley is a 73 year old female with a medical history significant for heart failure with preserved ejection fraction and COPD, who presented to the ED yesterday with progressive shortness of breath and leg swelling - found to be hypoxic on outpatient assessment with O2 sat of 60%. Started on 2L nasal canula and brought to the ED where evaluation appeared consistent with hypoxic respiratory failure secondary to CHF exacerbation. BNP elevation and CXR findings consistent with CHF exacerbation. She has received IV lasix and solumedrol with some improvement of her symptoms.She still appears somewhat fluid overloaded on exam today, so continue with plan for  diuresis. Patient did have elevated troponin, however, no chest pain and no concerning EKG changes. Lower suspicion for ACS.   - Agree with current plan for diuresis - convert to oral lasix when able  - Continue with blood pressure management - Will follow up ECHO results - Close outpatient follow up with cardiology in 1-2 weeks   The patient's history and exam findings were discussed with Dr. Nehemiah Massed. The plan was made in  conjunction with Dr. Nehemiah Massed.  Signed: Hilbert Odor PA-C 01/15/2019, 12:38 PM  The patient has been interviewed and examined. I agree with assessment and plan above. Serafina Royals MD [facility]

## 2019-01-15 NOTE — Progress Notes (Signed)
PT Cancellation Note  Patient Details Name: Barbara Farley MRN: [id number] DOB: 1945-07-20   Cancelled Treatment:    Reason Eval/Treat Not Completed: Medical issues which prohibited therapy; Pt's BG 340 and trending up falling outside guidelines for participation with PT services.  Will continue to follow pt and attempt to see her at a later date/time as medically appropriate.     Linus Salmons PT, DPT 01/15/19, 11:50 AM

## 2019-01-16 LAB — BASIC METABOLIC PANEL
Anion gap: 8 (ref 5–15)
BUN: 28 mg/dL — ABNORMAL HIGH (ref 8–23)
CO2: 33 mmol/L — ABNORMAL HIGH (ref 22–32)
Calcium: 9 mg/dL (ref 8.9–10.3)
Chloride: 99 mmol/L (ref 98–111)
Creatinine, Ser: 1.82 mg/dL — ABNORMAL HIGH (ref 0.44–1.00)
GFR calc Af Amer: 32 mL/min — ABNORMAL LOW (ref >60)
GFR calc non Af Amer: 27 mL/min — ABNORMAL LOW (ref >60)
Glucose, Bld: 60 mg/dL — ABNORMAL LOW (ref 70–99)
Potassium: 4.1 mmol/L (ref 3.5–5.1)
Sodium: 140 mmol/L (ref 135–145)

## 2019-01-16 LAB — GLUCOSE, CAPILLARY
Glucose-Capillary: 47 mg/dL — ABNORMAL LOW (ref 70–99)
Glucose-Capillary: 78 mg/dL (ref 70–99)
Glucose-Capillary: 93 mg/dL (ref 70–99)
Glucose-Capillary: 68 mg/dL — ABNORMAL LOW (ref 70–99)
Glucose-Capillary: 69 mg/dL — ABNORMAL LOW (ref 70–99)
Glucose-Capillary: 69 mg/dL — ABNORMAL LOW (ref 70–99)
Glucose-Capillary: 91 mg/dL (ref 70–99)

## 2019-01-16 LAB — HEMOGLOBIN A1C: Hgb A1c MFr Bld: UNDETERMINED % (ref 4.8–5.6)

## 2019-01-16 MED ORDER — FUROSEMIDE 40 MG PO TABS: 40.0000 mg | ORAL_TABLET | Freq: Two times a day (BID) | ORAL | 11 refills | Status: AC

## 2019-01-16 MED ORDER — CARVEDILOL 3.125 MG PO TABS: 3.1250 mg | ORAL_TABLET | Freq: Two times a day (BID) | ORAL | 0 refills | Status: AC

## 2019-01-16 NOTE — Discharge Instructions (Signed)
Pursed Lip Breathing Pursed lip breathing is a technique to relieve the feeling of being short of breath. Some long-term respiratory conditions, like chronic obstructive pulmonary disease (COPD) and severe asthma, can make it hard to breathe out (exhale) all of the air in your lungs. This can make air that has less oxygen than normal build up in your lungs (air trapping). Trapped air means your lungs fill with less fresh air when you breathe in (inhale). As a result, you feel short of breath. Pursed lip breathing keeps your airways open longer when you exhale and empties more air from your lungs. This makes more space for fresh air when you inhale. Pursed lip breathing can also slow down your breathing and help your body not have to work so hard to breathe. Over time, pursed lip breathing may help you be able to be more physically active and do more activities. How to perform pursed lip breathing Being short of breath can make you tense and anxious. Before you start this breathing exercise, take a minute to relax your shoulders and close your eyes. Then: 1. Start the exercise by closing your mouth. 2. Breathe in through your nose, taking a normal breath. You can do this at your normal rate of breathing. If you feel you are not getting enough air, breathe in while slowly counting to 2 or 3. 3. Pucker (purse) your lips as if you were going to whistle. 4. Gently tighten your abdomen muscles or press on your belly to help push the air out. 5. Breathe out slowly through your pursed lips. Take at least twice as long to breathe out as it takes you to breathe in. 6. Make sure that you breathe out all of the air, but do not force air out. 7. Repeat the exercise until your breathing improves. Ask your health care provider how often and how long to do this exercise. Follow these instructions at home:  Take over-the-counter and prescription medicines only as told by your health care provider.  Return to your  normal activities as told by your health care provider. Ask your health care provider what activities are safe for you.  Do not use any products that contain nicotine or tobacco, such as cigarettes and e-cigarettes. If you need help quitting, ask your health care provider.  Keep all follow-up visits as told by your health care provider. This is important. Contact a health care provider if:  Your shortness of breath gets worse.  You become less able to exercise or be active.  You develop a cough.  You develop a fever. Get help right away if:  You are struggling to breathe.  Your shortness of breath prevents you from engaging in any activity. Summary  Pursed lip breathing is a breathing technique that helps to remove trapped air from your lungs. It helps you get more oxygen into your lungs and makes your body have to work less hard to breathe.  Pursed lip breathing can gradually make you more able to be physically active.  You can do pursed lip breathing on your own at home.  Ask your health care provider how often and how long you should do pursed lip breathing. This information is not intended to replace advice given to you by your health care provider. Make sure you discuss any questions you have with your health care provider. Document Released: 02/01/2008 Document Revised: 04/06/2017 Document Reviewed: 03/16/2016 Elsevier Patient Education  2020 [place], Adult Shortness  of breath is when a person has trouble breathing enough air or when a person feels like she or he is having trouble breathing in enough air. Shortness of breath could be a sign of a medical problem. Follow these instructions at home:   Pay attention to any changes in your symptoms.  Do not use any products that contain nicotine or tobacco, such as cigarettes, e-cigarettes, and chewing tobacco.  Do not smoke. Smoking is a common cause of shortness of breath. If you need help  quitting, ask your health care provider.  Avoid things that can irritate your airways, such as: ? Mold. ? Dust. ? Air pollution. ? Chemical fumes. ? Things that can cause allergy symptoms (allergens), if you have allergies.  Keep your living space clean and free of mold and dust.  Rest as needed. Slowly return to your usual activities.  Take over-the-counter and prescription medicines only as told by your health care provider. This includes oxygen therapy and inhaled medicines.  Keep all follow-up visits as told by your health care provider. This is important. Contact a health care provider if:  Your condition does not improve as soon as expected.  You have a hard time doing your normal activities, even after you rest.  You have new symptoms. Get help right away if:  Your shortness of breath gets worse.  You have shortness of breath when you are resting.  You feel light-headed or you faint.  You have a cough that is not controlled with medicines.  You cough up blood.  You have pain with breathing.  You have pain in your chest, arms, shoulders, or abdomen.  You have a fever.  You cannot walk up stairs or exercise the way that you normally do. These symptoms may represent a serious problem that is an emergency. Do not wait to see if the symptoms will go away. Get medical help right away. Call your local emergency services (911 in the [place] Do not drive yourself to the hospital. Summary  Shortness of breath is when a person has trouble breathing enough air. It can be a sign of a medical problem.  Avoid things that irritate your lungs, such as smoking, pollution, mold, and dust.  Pay attention to changes in your symptoms and contact your health care provider if you have a hard time completing daily activities because of shortness of breath. This information is not intended to replace advice given to you by your health care provider. Make sure you discuss any questions  you have with your health care provider. Document Released: 01/17/2001 Document Revised: 09/24/2017 Document Reviewed: 09/24/2017 Elsevier Patient Education  2020 [employer].

## 2019-01-16 NOTE — Plan of Care (Signed)
  Problem: Elimination: Goal: Will not experience complications related to bowel motility Outcome: Progressing   Problem: Safety: Goal: Ability to remain free from injury will improve Outcome: Progressing   Problem: Skin Integrity: Goal: Risk for impaired skin integrity will decrease Outcome: Progressing   

## 2019-01-16 NOTE — Plan of Care (Signed)
  Problem: Education: Goal: Knowledge of General Education information will improve Description: Including pain rating scale, medication(s)/side effects and non-pharmacologic comfort measures Outcome: Adequate for Discharge   Problem: Health Behavior/Discharge Planning: Goal: Ability to manage health-related needs will improve Outcome: Adequate for Discharge   Problem: Clinical Measurements: Goal: Ability to maintain clinical measurements within normal limits will improve Outcome: Adequate for Discharge Goal: Will remain free from infection Outcome: Adequate for Discharge Goal: Diagnostic test results will improve Outcome: Adequate for Discharge Goal: Respiratory complications will improve Outcome: Adequate for Discharge Goal: Cardiovascular complication will be avoided Outcome: Adequate for Discharge   Problem: Activity: Goal: Risk for activity intolerance will decrease Outcome: Adequate for Discharge   Problem: Nutrition: Goal: Adequate nutrition will be maintained Outcome: Adequate for Discharge   Problem: Coping: Goal: Level of anxiety will decrease Outcome: Adequate for Discharge   Problem: Elimination: Goal: Will not experience complications related to bowel motility Outcome: Adequate for Discharge Goal: Will not experience complications related to urinary retention Outcome: Adequate for Discharge   Problem: Pain Managment: Goal: General experience of comfort will improve Outcome: Adequate for Discharge   Problem: Safety: Goal: Ability to remain free from injury will improve Outcome: Adequate for Discharge   Problem: Skin Integrity: Goal: Risk for impaired skin integrity will decrease Outcome: Adequate for Discharge   Problem: Food- and Nutrition-Related Knowledge Deficit (NB-1.1) Goal: Nutrition education Description: Formal process to instruct or train a patient/client in a skill or to impart knowledge to help patients/clients voluntarily manage or modify  food choices and eating behavior to maintain or improve health. Outcome: Adequate for Discharge

## 2019-01-16 NOTE — Progress Notes (Signed)
Inpatient Diabetes Program Recommendations  AACE/ADA: New Consensus Statement on Inpatient Glycemic Control (2015)  Target Ranges:  Prepandial:   less than 140 mg/dL      Peak postprandial:   less than 180 mg/dL (1-2 hours)      Critically ill patients:  140 - 180 mg/dL   Lab Results  Component Value Date   GLUCAP 93 01/16/2019   HGBA1C 8.0 (H) 06/18/2015    Review of Glycemic Control Results for Barbara Farley, Barbara Farley (MRN [id number]) as of 01/16/2019 10:26  Ref. Range 01/15/2019 18:03 01/15/2019 20:26 01/16/2019 06:18 01/16/2019 06:43 01/16/2019 08:03  Glucose-Capillary Latest Ref Range: 70 - 99 mg/dL 334 (H) Total No 338 (H) 47 (L) 78 93   Diabetes history: DM2 Outpatient Diabetes medications: Lantus 26 units + Ozempic Current orders for Inpatient glycemic control: Novolog 3 units tid meal coverage if eats 50% meals +Novolog sensitive correction tid + hs 0-5 units  Inpatient Diabetes Program Recommendations:   May consider restart 50% lantus = 13 units when CBGs trend upward.  Thank you, Nani Gasser. Tamekia Rotter, RN, MSN, CDE  Diabetes Coordinator Inpatient Glycemic Control Team Team Pager [phone] (8am-5pm) 01/16/2019 10:33 AM

## 2019-01-16 NOTE — Progress Notes (Signed)
Hypoglycemic Event  CBG: 47  Treatment: 8oz oranage juice  Symptoms: Pt states she was feeling tired  Follow-up CBG:78 Time:643   Possible Reasons for Event: bedtime insulin  Comments/MD notified:Dr mansy notified. Orders to hold lantus for at least 24 hrs    Kendon Sedeno M Samuel-Boseman

## 2019-01-16 NOTE — TOC Transition Note (Signed)
Transition of Care [facility]) - CM/SW Discharge Note   Patient Details  Name: ANAKARINA BOLAS MRN: [id number] Date of Birth: 12/18/1945  Transition of Care [facility] CM/SW Contact:  Ross Ludwig, LCSW Phone Number: 01/16/2019, 12:47 PM   Clinical Narrative:     CSW spoke with patient she is part of the [place] program.  Patient has caregivers coming to her home 7 days a week.  CSW contacted [facility] and they will set up patient's oxygen, and also facilitate transfer back home.  CSW was informed by [facility] that they will be able to pick patient up at 12:00pm today.  CSW updated bedside nurse, patient will be discharging with [facility].   Final next level of care: [place] Barriers to Discharge: Barriers Resolved   Patient Goals and CMS Choice Patient states their goals for this hospitalization and ongoing recovery are:: To return back home CMS Medicare.gov Compare Post Acute Care list provided to:: Patient Choice offered to / list presented to : Patient  Discharge Placement  Patient will be returning back home and continued to be followed by [facility].                       Discharge Plan and Services                DME Arranged: N/A DME Agency: NA         HH Agency: Other - See comment([facility]) Date HH Agency Contacted: 01/16/19 Time HH Agency Contacted: 25 Representative spoke with at [place]: Advertising account executive  Social Determinants of Health ([place]) Interventions     Readmission Risk Interventions No flowsheet data found.

## 2019-01-16 NOTE — Progress Notes (Signed)
Pt glucose level 47 this am at 0624. Pt had a [place] sandwich last night after bedtime insulin given. Pt reports she was feeling tired this am. 8 0z of orange juice given at 0624 , glucose level rechecked at 0643 , glucose increased to 78. Pt is asymptomatic. Dr Sidney Ace notified via secure chat. Orders given to hold lantus insulin  for at least 24 hrs.will pass on to oncoming nurse.

## 2019-01-16 NOTE — Progress Notes (Signed)
CRITICAL VALUE ALERT  Critical Value:  CBG 68  Date & Time Notied:  01/16/2019 @1130   Provider Notified: Dr Posey Pronto  Orders Received/Actions taken: follow protocol.

## 2019-01-16 NOTE — Progress Notes (Signed)
Pt's CBG was 68. Provided with 8oz of OJ. Pt denies symptoms. Pt rechecked and noted to be 69. Recheck on opposite hand and noted to also be 69. Pt provided with ice cream, per her request and more orange juice.

## 2019-01-16 NOTE — Progress Notes (Signed)
SATURATION QUALIFICATIONS: (This note is used to comply with regulatory documentation for home oxygen)  Patient Saturations on Room Air at Rest = 86%  Patient Saturations on Room Air while Ambulating = 78%  Patient Saturations on 2 Liters of oxygen while Ambulating = 91%  Please briefly explain why patient needs home oxygen:

## 2019-01-16 NOTE — Discharge Summary (Signed)
[employer] - [place] at [facility], 74 y.o., DOB February 13, 1946, MRN [id number]. Admission date: 01/14/2019 Discharge Date 01/16/2019 [facility], [place] Admitting Physician Fritzi Mandes, MD  Admission Diagnosis  SOB (shortness of breath) [R06.02]  Discharge Diagnosis   Active Problems: Acute respiratory failure hypoxia secondary acute on chronic diastolic CHF Malignant hypertension Elevated troponin due to demand is Diabetes type 2 Sleep apnea [place]  is a 73 y.o. female with a known history of morbid obesity, sleep apnea, CHF chronic diastolic, COPD, diabetes comes to the emergency room with increasing shortness of breath and leg edema. She was seen by her pace physician found to have saturations at 60%. She was placed on 2 L nasal cannula came to the emergency room found to be hypotensive with blood pressure in the [id number] systolic over diastolic [id number].  Patient was admitted for acute on chronic diastolic CHF.  Patient was diuresed well she continues to have hypoxia requiring oxygen.  Patient also had some hypoglycemia which was treated.  Patient is doing better and wants to go home.  Home oxygen has been arranged by the pace program.            Consults  None  Significant Tests:  See full reports for all details     Dg Chest Port 1 View  Result Date: 01/14/2019 CLINICAL DATA:  73 year old with acute onset of cough, shortness of breath and RIGHT LOWER extremity edema. Current history of diabetes, hypertension, COPD, and CHF. Former smoker. EXAM: PORTABLE CHEST 1 VIEW COMPARISON:  CT chest 12/06/2017 and earlier. Chest x-ray 09/17/2016 and earlier. FINDINGS: Cardiac silhouette mildly to moderately enlarged for AP technique, unchanged. Mild diffuse interstitial pulmonary edema. No confluent airspace consolidation. No visible pleural effusions. IMPRESSION: Mild CHF, with stable cardiomegaly and mild diffuse  interstitial pulmonary edema. Electronically Signed   By: Evangeline Dakin M.D.   On: 01/14/2019 14:18       Today   Subjective:   Barbara Farley patient feels better shortness of breath improved  Objective:   Blood pressure (!) 141/77, pulse 83, temperature (!) 97.4 F (36.3 C), temperature source Oral, resp. rate 19, height 5\' 6"  (1.676 m), weight 115.9 kg, SpO2 100 %.  .  Intake/Output Summary (Last 24 hours) at 01/16/2019 1419 Last data filed at 01/15/2019 2201 Gross per 24 hour  Intake -  Output 902 ml  Net -902 ml    Exam VITAL SIGNS: Blood pressure (!) 141/77, pulse 83, temperature (!) 97.4 F (36.3 C), temperature source Oral, resp. rate 19, height 5\' 6"  (1.676 m), weight 115.9 kg, SpO2 100 %.  GENERAL:  73 y.o.-year-old patient lying in the bed with no acute distress.  EYES: Pupils equal, round, reactive to light and accommodation. No scleral icterus. Extraocular muscles intact.  HEENT: Head atraumatic, normocephalic. Oropharynx and nasopharynx clear.  NECK:  Supple, no jugular venous distention. No thyroid enlargement, no tenderness.  LUNGS: Normal breath sounds bilaterally, no wheezing, rales,rhonchi or crepitation. No use of accessory muscles of respiration.  CARDIOVASCULAR: S1, S2 normal. No murmurs, rubs, or gallops.  ABDOMEN: Soft, nontender, nondistended. Bowel sounds present. No organomegaly or mass.  EXTREMITIES: No pedal edema, cyanosis, or clubbing.  NEUROLOGIC: Cranial nerves II through XII are intact. Muscle strength 5/5 in all extremities. Sensation intact. Gait not checked.  PSYCHIATRIC: The patient is alert and oriented x 3.  SKIN: No obvious rash, lesion, or ulcer.   Data Review  CBC w Diff:  Lab Results  Component Value Date   WBC 8.2 01/14/2019   HGB 13.5 01/14/2019   HGB 11.8 (L) 05/19/2013   HCT 44.8 01/14/2019   HCT 36.1 05/19/2013   PLT 186 01/14/2019   PLT 182 05/19/2013   LYMPHOPCT 5 01/14/2019   LYMPHOPCT 19.7 05/19/2013    MONOPCT 1 01/14/2019   MONOPCT 6.3 05/19/2013   EOSPCT 0 01/14/2019   EOSPCT 2.5 05/19/2013   BASOPCT 0 01/14/2019   BASOPCT 0.9 05/19/2013   CMP:  Lab Results  Component Value Date   NA 140 01/16/2019   NA 136 05/19/2013   K 4.1 01/16/2019   K 4.3 05/19/2013   CL 99 01/16/2019   CL 104 05/19/2013   CO2 33 (H) 01/16/2019   CO2 27 05/19/2013   BUN 28 (H) 01/16/2019   BUN 21 (H) 05/19/2013   CREATININE 1.82 (H) 01/16/2019   CREATININE 1.41 (H) 05/19/2013   PROT 6.4 (L) 01/14/2019   PROT 7.3 05/17/2013   ALBUMIN 3.4 (L) 01/14/2019   ALBUMIN 3.4 05/17/2013   BILITOT 1.1 01/14/2019   BILITOT 0.5 05/17/2013   ALKPHOS 82 01/14/2019   ALKPHOS 134 (H) 05/17/2013   AST 18 01/14/2019   AST 14 (L) 05/17/2013   ALT 14 01/14/2019   ALT 22 05/17/2013  .  Micro Results Recent Results (from the past 240 hour(s))  SARS Coronavirus 2 [facility] order, Performed in [facility] hospital lab) Nasopharyngeal Nasopharyngeal Swab     Status: None   Collection Time: 01/14/19  2:20 PM   Specimen: Nasopharyngeal Swab  Result Value Ref Range Status   SARS Coronavirus 2 NEGATIVE NEGATIVE Final    Comment: (NOTE) If result is NEGATIVE SARS-CoV-2 target nucleic acids are NOT DETECTED. The SARS-CoV-2 RNA is generally detectable in upper and lower  respiratory specimens during the acute phase of infection. The lowest  concentration of SARS-CoV-2 viral copies this assay can detect is 250  copies / mL. A negative result does not preclude SARS-CoV-2 infection  and should not be used as the sole basis for treatment or other  patient management decisions.  A negative result may occur with  improper specimen collection / handling, submission of specimen other  than nasopharyngeal swab, presence of viral mutation(s) within the  areas targeted by this assay, and inadequate number of viral copies  (<250 copies / mL). A negative result must be combined with clinical  observations, patient history, and  epidemiological information. If result is POSITIVE SARS-CoV-2 target nucleic acids are DETECTED. The SARS-CoV-2 RNA is generally detectable in upper and lower  respiratory specimens dur ing the acute phase of infection.  Positive  results are indicative of active infection with SARS-CoV-2.  Clinical  correlation with patient history and other diagnostic information is  necessary to determine patient infection status.  Positive results do  not rule out bacterial infection or co-infection with other viruses. If result is PRESUMPTIVE POSTIVE SARS-CoV-2 nucleic acids MAY BE PRESENT.   A presumptive positive result was obtained on the submitted specimen  and confirmed on repeat testing.  While 2019 novel coronavirus  (SARS-CoV-2) nucleic acids may be present in the submitted sample  additional confirmatory testing may be necessary for epidemiological  and / or clinical management purposes  to differentiate between  SARS-CoV-2 and other Sarbecovirus currently known to infect humans.  If clinically indicated additional testing with an alternate test  methodology [phone]) is advised. The SARS-CoV-2 RNA is generally  detectable in upper and  lower respiratory sp ecimens during the acute  phase of infection. The expected result is Negative. Fact Sheet for Patients:  StrictlyIdeas.no Fact Sheet for Healthcare Providers: BankingDealers.co.za This test is not yet approved or cleared by the [place] FDA and has been authorized for detection and/or diagnosis of SARS-CoV-2 by FDA under an Emergency Use Authorization (EUA).  This EUA will remain in effect (meaning this test can be used) for the duration of the COVID-19 declaration under Section 564(b)(1) of the Act, 21 U.S.C. section 360bbb-3(b)(1), unless the authorization is terminated or revoked sooner. Performed at [facility], [address] [place], [place] [address]          Code Status Orders  (From admission, onward)         Start     Ordered   01/14/19 1758  Do not attempt resuscitation (DNR)  Continuous    Question Answer Comment  In the event of cardiac or respiratory ARREST Do not call a "code blue"   In the event of cardiac or respiratory ARREST Do not perform Intubation, CPR, defibrillation or ACLS   In the event of cardiac or respiratory ARREST Use medication by any route, position, wound care, and other measures to relive pain and suffering. May use oxygen, suction and manual treatment of airway obstruction as needed for comfort.   Comments HAs DNR papers      01/14/19 1757        Code Status History    Date Active Date Inactive Code Status Order ID Comments User Context   06/18/2015 1518 06/19/2015 1726 Full Code [id number]  Dustin Flock, MD ED   Advance Care Planning Activity    Advance Directive Documentation     Most Recent Value  Type of Advance Directive  Out of facility DNR ([place] MOST or yellow form), Living will, Healthcare Power of Attorney  Pre-existing out of facility DNR order (yellow form or [place] MOST form)  -  "MOST" Form in Place?  -          Follow-up Information    [place] Follow up on 01/23/2019.   Specialty: Cardiology Why: at 10:30am. Please enter through the [place] entrance Contact information: [address] [place] [phone]       Follow up In 6 days.           Discharge Medications   Allergies as of 01/16/2019   No Known Allergies     Medication List    TAKE these medications   acetaminophen 325 MG tablet Commonly known as: TYLENOL Take 650 mg by mouth every 6 (six) hours as needed for mild pain or headache.   aspirin EC 81 MG tablet Take 81 mg by mouth daily.   atorvastatin 40 MG tablet Commonly known as: LIPITOR Take 40 mg by mouth daily.   calcium carbonate 500 MG chewable  tablet Commonly known as: TUMS - dosed in mg elemental calcium Chew 1 tablet by mouth 2 (two) times daily as needed for indigestion or heartburn.   camphor-menthol lotion Commonly known as: SARNA Apply 1 application topically 3 (three) times daily as needed (sore legs).   carvedilol 3.125 MG tablet Commonly known as: COREG Take 1 tablet (3.125 mg total) by mouth 2 (two) times daily with a meal.   cholecalciferol 25 MCG (1000 UT) tablet Commonly known as: VITAMIN D3 Take 1,000 Units by mouth daily.   Desitin 13 % Crea  Generic drug: Zinc Oxide Apply 1 application topically 3 (three) times daily as needed (skin irritation).   furosemide 40 MG tablet Commonly known as: Lasix Take 1 tablet (40 mg total) by mouth 2 (two) times daily. What changed:   medication strength  how much to take  when to take this   Glucagon Emergency 1 MG injection Generic drug: glucagon Inject 1 mg into the skin once as needed (hypoglycemic emergency).   glucose 4 GM chewable tablet Chew 1 tablet by mouth See admin instructions. Take 1 tablet by mouth for glucose less than 70 and repeat every 5 mintues until blood glucose greater than 70   insulin glargine 100 UNIT/ML injection Commonly known as: LANTUS Inject 26 Units into the skin daily.   isosorbide mononitrate 60 MG 24 hr tablet Commonly known as: IMDUR Take 60 mg by mouth daily.   lisinopril 40 MG tablet Commonly known as: ZESTRIL Take 40 mg by mouth daily.   nitroGLYCERIN 0.4 MG SL tablet Commonly known as: NITROSTAT Place 0.4 mg under the tongue every 5 (five) minutes as needed for chest pain.   Ozempic (0.25 or 0.5 MG/DOSE) 2 MG/1.5ML Sopn Generic drug: Semaglutide(0.25 or 0.5MG /DOS) Inject 0.5 mg into the skin once a week.   polyethylene glycol powder 17 GM/SCOOP powder Commonly known as: GLYCOLAX/MIRALAX Take 17 g by mouth daily.   senna 8.6 MG Tabs tablet Commonly known as: SENOKOT Take 2 tablets by mouth 2 (two) times  daily.   simethicone 80 MG chewable tablet Commonly known as: MYLICON Chew 80 mg by mouth 3 (three) times daily as needed for flatulence.   spironolactone 25 MG tablet Commonly known as: ALDACTONE Take 25 mg by mouth daily.   tiotropium 18 MCG inhalation capsule Commonly known as: SPIRIVA Place 18 mcg into inhaler and inhale daily.   vitamin A & D ointment Apply 1 application topically daily as needed (skin protection - buttocks).            Durable Medical Equipment  (From admission, onward)         Start     Ordered   01/16/19 1021  For home use only DME oxygen  Once    Question Answer Comment  Length of Need Lifetime   Mode or (Route) Nasal cannula   Liters per Minute 2   Frequency Continuous (stationary and portable oxygen unit needed)   Oxygen delivery system Gas      01/16/19 1020             Total Time in preparing paper work, data evaluation and todays exam - 55 minutes  Dustin Flock M.D on 01/16/2019 at 2:19 PM [place]  [phone]

## 2019-01-16 NOTE — Progress Notes (Signed)
Nsg Discharge Note  Admit Date:  01/14/2019 Discharge date: 01/16/2019   Barbara Farley to be D/C'd Home per MD order.  AVS completed.  Copy for chart, and copy for patient signed, and dated. Patient/caregiver able to verbalize understanding.  Discharge Medication: Allergies as of 01/16/2019   No Known Allergies     Medication List    TAKE these medications   acetaminophen 325 MG tablet Commonly known as: TYLENOL Take 650 mg by mouth every 6 (six) hours as needed for mild pain or headache.   aspirin EC 81 MG tablet Take 81 mg by mouth daily.   atorvastatin 40 MG tablet Commonly known as: LIPITOR Take 40 mg by mouth daily.   calcium carbonate 500 MG chewable tablet Commonly known as: TUMS - dosed in mg elemental calcium Chew 1 tablet by mouth 2 (two) times daily as needed for indigestion or heartburn.   camphor-menthol lotion Commonly known as: SARNA Apply 1 application topically 3 (three) times daily as needed (sore legs).   carvedilol 3.125 MG tablet Commonly known as: COREG Take 1 tablet (3.125 mg total) by mouth 2 (two) times daily with a meal.   cholecalciferol 25 MCG (1000 UT) tablet Commonly known as: VITAMIN D3 Take 1,000 Units by mouth daily.   Desitin 13 % Crea Generic drug: Zinc Oxide Apply 1 application topically 3 (three) times daily as needed (skin irritation).   furosemide 40 MG tablet Commonly known as: Lasix Take 1 tablet (40 mg total) by mouth 2 (two) times daily. What changed:   medication strength  how much to take  when to take this   Glucagon Emergency 1 MG injection Generic drug: glucagon Inject 1 mg into the skin once as needed (hypoglycemic emergency).   glucose 4 GM chewable tablet Chew 1 tablet by mouth See admin instructions. Take 1 tablet by mouth for glucose less than 70 and repeat every 5 mintues until blood glucose greater than 70   insulin glargine 100 UNIT/ML injection Commonly known as: LANTUS Inject 26 Units into the  skin daily.   isosorbide mononitrate 60 MG 24 hr tablet Commonly known as: IMDUR Take 60 mg by mouth daily.   lisinopril 40 MG tablet Commonly known as: ZESTRIL Take 40 mg by mouth daily.   nitroGLYCERIN 0.4 MG SL tablet Commonly known as: NITROSTAT Place 0.4 mg under the tongue every 5 (five) minutes as needed for chest pain.   Ozempic (0.25 or 0.5 MG/DOSE) 2 MG/1.5ML Sopn Generic drug: Semaglutide(0.25 or 0.5MG /DOS) Inject 0.5 mg into the skin once a week.   polyethylene glycol powder 17 GM/SCOOP powder Commonly known as: GLYCOLAX/MIRALAX Take 17 g by mouth daily.   senna 8.6 MG Tabs tablet Commonly known as: SENOKOT Take 2 tablets by mouth 2 (two) times daily.   simethicone 80 MG chewable tablet Commonly known as: MYLICON Chew 80 mg by mouth 3 (three) times daily as needed for flatulence.   spironolactone 25 MG tablet Commonly known as: ALDACTONE Take 25 mg by mouth daily.   tiotropium 18 MCG inhalation capsule Commonly known as: SPIRIVA Place 18 mcg into inhaler and inhale daily.   vitamin A & D ointment Apply 1 application topically daily as needed (skin protection - buttocks).            Durable Medical Equipment  (From admission, onward)         Start     Ordered   01/16/19 1021  For home use only DME oxygen  Once  Question Answer Comment  Length of Need Lifetime   Mode or (Route) Nasal cannula   Liters per Minute 2   Frequency Continuous (stationary and portable oxygen unit needed)   Oxygen delivery system Gas      01/16/19 1020          Discharge Assessment: Vitals:   01/16/19 0351 01/16/19 0802  BP: 119/62 (!) 141/77  Pulse: 86 83  Resp: 20 19  Temp: 97.8 F (36.6 C) (!) 97.4 F (36.3 C)  SpO2: 100% 100%   Skin clean, dry and intact without evidence of skin break down, no evidence of skin tears noted. IV catheter discontinued intact. Site without signs and symptoms of complications - no redness or edema noted at insertion site,  patient denies c/o pain - only slight tenderness at site.  Dressing with slight pressure applied.  D/c Instructions-Education: Discharge instructions given to patient/family with verbalized understanding. D/c education completed with patient/family including follow up instructions, medication list, d/c activities limitations if indicated, with other d/c instructions as indicated by MD - patient able to verbalize understanding, all questions fully answered. Patient instructed to return to ED, call 911, or call MD for any changes in condition.  Patient escorted via [place], and D/C home via private auto.  Eda Keys, RN 01/16/2019 11:48 AM

## 2019-01-17 LAB — GLUCOSE, CAPILLARY: Glucose-Capillary: 269 mg/dL — ABNORMAL HIGH (ref 70–99)

## 2019-01-22 NOTE — Progress Notes (Deleted)
   Patient ID: Barbara Farley, female    DOB: November 08, 1945, 73 y.o.   MRN: [id number]  HPI  Barbara Farley is a 73 y/o female with a history of  Echo report from 01/15/2019 reviewed and showed an EF of 60-65%.   Admitted 01/14/2019 due to acute on chronic HF along with HTN. Cardiology consult obtained. Initially needed IV lasix and then transitioned to oral diuretics. Discharged after 2 days.   She presents today for her initial visit with a chief complaint of   Review of Systems    Physical Exam    Assessment & Plan:  1: Chronic heart failure with preserved ejection fraction- - NYHA class - saw cardiology (Barbara Farley) in 2017 - BNP 01/14/2019 was 216.0  2: HTN- - BP - being followed by PACE providers - BMP from 01/16/2019 reviewed and showed sodium 140, potassium 4.1, creatinine 1.82 and GFR 32  3: DM-  4: Obstructive sleep apnea-

## 2019-01-23 ENCOUNTER — Ambulatory Visit: Payer: Medicare (Managed Care) | Admitting: Family

## 2019-01-23 ENCOUNTER — Telehealth: Payer: Self-pay | Admitting: Family

## 2019-01-23 NOTE — Telephone Encounter (Signed)
Patient did not show for her [facility] appointment on 01/23/2019. Will attempt to reschedule.

## 2019-03-04 ENCOUNTER — Other Ambulatory Visit: Payer: Self-pay | Admitting: Family Medicine

## 2019-03-04 DIAGNOSIS — R911 Solitary pulmonary nodule: Secondary | ICD-10-CM

## 2019-03-18 ENCOUNTER — Ambulatory Visit: Payer: Medicare (Managed Care)

## 2019-04-08 DEATH — deceased
